# Patient Record
Sex: Female | Born: 1960 | Race: Black or African American | Hispanic: No | Marital: Married | Smoking: Former smoker
Health system: Southern US, Community
[De-identification: ages and names within clinical notes are randomized; demographics above are authoritative.]

## PROBLEM LIST (undated history)

## (undated) DIAGNOSIS — M359 Systemic involvement of connective tissue, unspecified: Secondary | ICD-10-CM

## (undated) DIAGNOSIS — M329 Systemic lupus erythematosus, unspecified: Secondary | ICD-10-CM

## (undated) DIAGNOSIS — E119 Type 2 diabetes mellitus without complications: Secondary | ICD-10-CM

## (undated) DIAGNOSIS — K5792 Diverticulitis of intestine, part unspecified, without perforation or abscess without bleeding: Secondary | ICD-10-CM

## (undated) DIAGNOSIS — Z8619 Personal history of other infectious and parasitic diseases: Secondary | ICD-10-CM

## (undated) DIAGNOSIS — I10 Essential (primary) hypertension: Secondary | ICD-10-CM

## (undated) DIAGNOSIS — IMO0002 Reserved for concepts with insufficient information to code with codable children: Secondary | ICD-10-CM

## (undated) DIAGNOSIS — M1990 Unspecified osteoarthritis, unspecified site: Secondary | ICD-10-CM

## (undated) DIAGNOSIS — G43909 Migraine, unspecified, not intractable, without status migrainosus: Secondary | ICD-10-CM

## (undated) DIAGNOSIS — Z5189 Encounter for other specified aftercare: Secondary | ICD-10-CM

## (undated) DIAGNOSIS — T7840XA Allergy, unspecified, initial encounter: Secondary | ICD-10-CM

## (undated) HISTORY — PX: ABDOMINAL HYSTERECTOMY: SHX81

## (undated) HISTORY — DX: Personal history of other infectious and parasitic diseases: Z86.19

## (undated) HISTORY — DX: Migraine, unspecified, not intractable, without status migrainosus: G43.909

## (undated) HISTORY — DX: Allergy, unspecified, initial encounter: T78.40XA

## (undated) HISTORY — PX: VASCULAR SURGERY: SHX849

## (undated) HISTORY — PX: LEG SURGERY: SHX1003

## (undated) HISTORY — DX: Unspecified osteoarthritis, unspecified site: M19.90

## (undated) HISTORY — DX: Diverticulitis of intestine, part unspecified, without perforation or abscess without bleeding: K57.92

## (undated) HISTORY — DX: Encounter for other specified aftercare: Z51.89

---

## 2016-11-03 ENCOUNTER — Emergency Department
Admission: EM | Admit: 2016-11-03 | Discharge: 2016-11-03 | Disposition: A | Discharge status: Home / Self Care | Payer: BLUE CROSS/BLUE SHIELD | Attending: Emergency Medicine | Admitting: Emergency Medicine

## 2016-11-03 ENCOUNTER — Encounter: Payer: Self-pay | Admitting: Emergency Medicine

## 2016-11-03 DIAGNOSIS — F17200 Nicotine dependence, unspecified, uncomplicated: Secondary | ICD-10-CM | POA: Diagnosis not present

## 2016-11-03 DIAGNOSIS — I10 Essential (primary) hypertension: Secondary | ICD-10-CM

## 2016-11-03 HISTORY — DX: Systemic lupus erythematosus, unspecified: M32.9

## 2016-11-03 HISTORY — DX: Essential (primary) hypertension: I10

## 2016-11-03 HISTORY — DX: Reserved for concepts with insufficient information to code with codable children: IMO0002

## 2016-11-03 LAB — BASIC METABOLIC PANEL
ANION GAP: 7 (ref 5–15)
BUN: 15 mg/dL (ref 6–20)
CHLORIDE: 106 mmol/L (ref 101–111)
CO2: 24 mmol/L (ref 22–32)
CREATININE: 0.75 mg/dL (ref 0.44–1.00)
Calcium: 9.3 mg/dL (ref 8.9–10.3)
GFR calc non Af Amer: >60 mL/min (ref >60)
Glucose, Bld: 99 mg/dL (ref 65–99)
POTASSIUM: 4 mmol/L (ref 3.5–5.1)
SODIUM: 137 mmol/L (ref 135–145)

## 2016-11-03 LAB — CBC WITH DIFFERENTIAL/PLATELET
BASOS ABS: 0.1 10*3/uL (ref 0–0.1)
BASOS PCT: 1 %
EOS ABS: 0.1 10*3/uL (ref 0–0.7)
Eosinophils Relative: 1 %
HEMATOCRIT: 44.9 % (ref 35.0–47.0)
HEMOGLOBIN: 15 g/dL (ref 12.0–16.0)
Lymphocytes Relative: 32 %
Lymphs Abs: 1.8 10*3/uL (ref 1.0–3.6)
MCH: 28.7 pg (ref 26.0–34.0)
MCHC: 33.5 g/dL (ref 32.0–36.0)
MCV: 85.8 fL (ref 80.0–100.0)
MONOS PCT: 9 %
Monocytes Absolute: 0.5 10*3/uL (ref 0.2–0.9)
NEUTROS ABS: 3.3 10*3/uL (ref 1.4–6.5)
NEUTROS PCT: 57 %
Platelets: 275 10*3/uL (ref 150–440)
RBC: 5.24 MIL/uL — AB (ref 3.80–5.20)
RDW: 13.6 % (ref 11.5–14.5)
WBC: 5.8 10*3/uL (ref 3.6–11.0)

## 2016-11-03 LAB — TROPONIN I

## 2016-11-03 MED ORDER — HYDROCHLOROTHIAZIDE 12.5 MG PO TABS: 12.5000 mg | ORAL_TABLET | Freq: Every day | ORAL | 2 refills | Status: DC

## 2016-11-03 MED ORDER — HYDROCHLOROTHIAZIDE 25 MG PO TABS
25.0000 mg | ORAL_TABLET | Freq: Once | ORAL | Status: AC
  Administered 2016-11-03: 25 mg via ORAL
  Filled 2016-11-03: qty 1

## 2016-11-03 NOTE — ED Provider Notes (Signed)
[facility] Emergency Department Provider Note  ____________________________________________  Time seen: Approximately 1:53 PM  I have reviewed the triage vital signs and the nursing notes.   HISTORY  Chief Complaint Hypertension   HPI Sheila Eaton is a 56 y.o. female with a history of hypertension and lupus who presents for evaluation of elevated blood pressure. Patient reports that she has been out of her antihypertensives for a week. She recently moved to [facility]. She saw primary care doctor 5 days ago however her prescription as never sent to the pharmacy. Today she was at work and was feeling "off". The nurse at her work place took her BP which was in the [place]. Patient then went back to her PCP's office and received a prescription for her losartan and amlodipine combination. She reports that she took one this morning. Patient is also supposed to be on hydrochlorothiazide however was not given a prescription for it. She is complaining of intermittent episodes of headachethat she describes as frontal throbbing, moderate. No headaches today. She is also complaining of intermittent visual floaters, none at this time. Also had an episode of chest tightness two days ago lasting a few minutes which resolved with no intervention. No symptoms at this time. No facial droop, slurred speech, unilateral weakness or numbness. She's not been any blood thinners.   Past Medical History:  Diagnosis Date  . Hypertension   . Lupus     There are no active problems to display for this patient.   Past Surgical History:  Procedure Laterality Date  . ABDOMINAL HYSTERECTOMY    . LEG SURGERY    . VASCULAR SURGERY      Prior to Admission medications   Medication Sig Start Date End Date Taking? Authorizing Provider  hydrochlorothiazide (HYDRODIURIL) 12.5 MG tablet Take 1 tablet (12.5 mg total) by mouth daily. 11/03/16   Rudene Re, MD     Allergies Other  History reviewed. No pertinent family history.  Social History Social History  Substance Use Topics  . Smoking status: Current Some Day Smoker  . Smokeless tobacco: Never Used  . Alcohol use Yes    Review of Systems  Constitutional: Negative for fever. Eyes: Negative for visual changes. + floaters ENT: Negative for sore throat. Neck: No neck pain  Cardiovascular: + chest pain. Respiratory: Negative for shortness of breath. Gastrointestinal: Negative for abdominal pain, vomiting or diarrhea. Genitourinary: Negative for dysuria. Musculoskeletal: Negative for back pain. Skin: Negative for rash. Neurological: Negative for  weakness or numbness. + HA Psych: No SI or HI  ____________________________________________   PHYSICAL EXAM:  VITAL SIGNS: ED Triage Vitals  Enc Vitals Group     BP 11/03/16 1056 (!) 226/125     Pulse Rate 11/03/16 1056 70     Resp 11/03/16 1056 18     Temp 11/03/16 1056 98.7 F (37.1 C)     Temp Source 11/03/16 1056 Oral     SpO2 11/03/16 1056 99 %     Weight 11/03/16 1052 215 lb (97.5 kg)     Height 11/03/16 1052 5\' 8"  (1.727 m)     Head Circumference --      Peak Flow --      Pain Score 11/03/16 1248 0     Pain Loc --      Pain Edu? --      Excl. in [place]? --     Constitutional: Alert and oriented. Well appearing and in no apparent distress. HEENT:  Head: Normocephalic and atraumatic.         Eyes: Conjunctivae are normal. Sclera is non-icteric.       Mouth/Throat: Mucous membranes are moist.       Neck: Supple with no signs of meningismus. Cardiovascular: Regular rate and rhythm. No murmurs, gallops, or rubs. 2+ symmetrical distal pulses are present in all extremities. No JVD. Respiratory: Normal respiratory effort. Lungs are clear to auscultation bilaterally. No wheezes, crackles, or rhonchi.  Gastrointestinal: Soft, non tender, and non distended with positive bowel sounds. No rebound or  guarding. Musculoskeletal: Nontender with normal range of motion in all extremities. No edema, cyanosis, or erythema of extremities. Neurologic: Normal speech and language. A & O x3, PERRL, no nystagmus, CN II-XII intact, motor testing reveals good tone and bulk throughout. There is no evidence of pronator drift or dysmetria. Muscle strength is 5/5 throughout.  Sensory examination is intact. Gait is normal. Skin: Skin is warm, dry and intact. No rash noted. Psychiatric: Mood and affect are normal. Speech and behavior are normal.  ____________________________________________   LABS (all labs ordered are listed, but only abnormal results are displayed)  Labs Reviewed  CBC WITH DIFFERENTIAL/PLATELET - Abnormal; Notable for the following:       Result Value   RBC 5.24 (*)    All other components within normal limits  BASIC METABOLIC PANEL  TROPONIN I   ____________________________________________  EKG  ED ECG REPORT I, Rudene Re, the attending physician, personally viewed and interpreted this ECG.  Normal sinus rhythm, rate of 65, normal intervals, normal axis, Q waves in anterior leads, no ST elevations or depressions. No prior for comparison ____________________________________________  RADIOLOGY  none  ____________________________________________   PROCEDURES  Procedure(s) performed: None Procedures Critical Care performed:  None ____________________________________________   INITIAL IMPRESSION / ASSESSMENT AND PLAN / ED COURSE  56 y.o. female with a history of hypertension and lupus who presents for evaluation of elevated blood pressure in the setting of medication noncompliance for a week. Patient is completely neurologically intact and asymptomatic at this time. Had an episode of chest pain a week ago with a normal EKG and one troponin that is negative. No indication for head CT. Blood work with no acute findings including normal electrolytes and kidney  function. Patient was restarted on her losartan and amlodipine prior to arrival to the emergency room and was restarted on hydrochlorothiazide here. Her blood pressure is trending down area and she remains asymptomatic and well-appearing. Patient can be discharged home at this time     Pertinent labs & imaging results that were available during my care of the patient were reviewed by me and considered in my medical decision making (see chart for details).    ____________________________________________   FINAL CLINICAL IMPRESSION(S) / ED DIAGNOSES  Final diagnoses:  Hypertension, unspecified type      NEW MEDICATIONS STARTED DURING THIS VISIT:  New Prescriptions   HYDROCHLOROTHIAZIDE (HYDRODIURIL) 12.5 MG TABLET    Take 1 tablet (12.5 mg total) by mouth daily.     Note:  This document was prepared using Dragon voice recognition software and may include unintentional dictation errors.    Alfred Levins, [place], MD 11/03/16 [phone]

## 2016-11-03 NOTE — ED Notes (Signed)
Pt states used to take hydrochlorothiazide but ran out x week and doctor started her on amlodipine this AM.

## 2016-11-03 NOTE — ED Triage Notes (Addendum)
Pt c/o headache since yesterday. Took blood pressure and was up at home.  Elevated in triage. Denies CP/SHOB.  C/o headache and seeing floaters in both eyes.  Skin warm and dry. Respirations unlabored.  Pt is out both her blood pressure meds for a couple weeks.  Got set up at a doctor and saw them last week and they were supposed to have sent meds in and they never got sent in. Finally got meds and took it this morning but came because so elevated.

## 2016-11-03 NOTE — ED Notes (Signed)
Discussed pt with dr Alfred Levins

## 2016-11-03 NOTE — ED Notes (Signed)
Pt eating [employer] at bedside with visitor.

## 2017-05-17 ENCOUNTER — Other Ambulatory Visit: Payer: Self-pay

## 2017-05-17 ENCOUNTER — Encounter: Payer: Self-pay | Admitting: Emergency Medicine

## 2017-05-17 ENCOUNTER — Emergency Department
Admission: EM | Admit: 2017-05-17 | Discharge: 2017-05-17 | Disposition: A | Discharge status: Home / Self Care | Payer: BLUE CROSS/BLUE SHIELD | Attending: Student in an Organized Health Care Education/Training Program | Admitting: Student in an Organized Health Care Education/Training Program

## 2017-05-17 ENCOUNTER — Emergency Department: Payer: BLUE CROSS/BLUE SHIELD

## 2017-05-17 DIAGNOSIS — I10 Essential (primary) hypertension: Secondary | ICD-10-CM | POA: Diagnosis not present

## 2017-05-17 DIAGNOSIS — R079 Chest pain, unspecified: Secondary | ICD-10-CM | POA: Diagnosis present

## 2017-05-17 DIAGNOSIS — M3210 Systemic lupus erythematosus, organ or system involvement unspecified: Secondary | ICD-10-CM | POA: Insufficient documentation

## 2017-05-17 DIAGNOSIS — F17210 Nicotine dependence, cigarettes, uncomplicated: Secondary | ICD-10-CM | POA: Insufficient documentation

## 2017-05-17 DIAGNOSIS — Z79899 Other long term (current) drug therapy: Secondary | ICD-10-CM | POA: Insufficient documentation

## 2017-05-17 DIAGNOSIS — R0602 Shortness of breath: Secondary | ICD-10-CM | POA: Insufficient documentation

## 2017-05-17 HISTORY — DX: Systemic involvement of connective tissue, unspecified: M35.9

## 2017-05-17 LAB — CBC
HEMATOCRIT: 41.9 % (ref 35.0–47.0)
Hemoglobin: 14.2 g/dL (ref 12.0–16.0)
MCH: 29.4 pg (ref 26.0–34.0)
MCHC: 33.8 g/dL (ref 32.0–36.0)
MCV: 87.2 fL (ref 80.0–100.0)
Platelets: 317 10*3/uL (ref 150–440)
RBC: 4.81 MIL/uL (ref 3.80–5.20)
RDW: 13.5 % (ref 11.5–14.5)
WBC: 9.1 10*3/uL (ref 3.6–11.0)

## 2017-05-17 LAB — BASIC METABOLIC PANEL
ANION GAP: 11 (ref 5–15)
BUN: 18 mg/dL (ref 6–20)
CO2: 26 mmol/L (ref 22–32)
Calcium: 9.3 mg/dL (ref 8.9–10.3)
Chloride: 102 mmol/L (ref 101–111)
Creatinine, Ser: 0.69 mg/dL (ref 0.44–1.00)
Glucose, Bld: 89 mg/dL (ref 65–99)
POTASSIUM: 3.4 mmol/L — AB (ref 3.5–5.1)
Sodium: 139 mmol/L (ref 135–145)

## 2017-05-17 LAB — TROPONIN I: Troponin I: <0.03 ng/mL (ref <0.03)

## 2017-05-17 MED ORDER — HYDROCODONE-ACETAMINOPHEN 5-325 MG PO TABS: 1.0000 | ORAL_TABLET | ORAL | 0 refills | Status: DC | PRN

## 2017-05-17 MED ORDER — IOPAMIDOL (ISOVUE-370) INJECTION 76%
75.0000 mL | Freq: Once | INTRAVENOUS | Status: AC | PRN
  Administered 2017-05-17: 75 mL via INTRAVENOUS
  Filled 2017-05-17: qty 75

## 2017-05-17 MED ORDER — NAPROXEN 500 MG PO TABS: 500.0000 mg | ORAL_TABLET | Freq: Two times a day (BID) | ORAL | 0 refills | Status: AC

## 2017-05-17 MED ORDER — HYDROCODONE-ACETAMINOPHEN 5-325 MG PO TABS
1.0000 | ORAL_TABLET | Freq: Once | ORAL | Status: AC
  Administered 2017-05-17: 1 via ORAL
  Filled 2017-05-17: qty 1

## 2017-05-17 NOTE — ED Triage Notes (Signed)
ARrives from [place] for ED evaluation of Chest pain and SOB since Friday.  Denies c/o cough and fever.  STates pain is improved with muscle relaxers.

## 2017-05-17 NOTE — ED Provider Notes (Signed)
[facility] Emergency Department Provider Note    First MD Initiated Contact with Patient 05/17/17 1457     (approximate)  I have reviewed the triage vital signs and the nursing notes.   HISTORY  Chief Complaint Chest Pain    HPI Sheila Eaton is a 57 y.o. female history of hypertension as well as lupus presents for evaluation of chest pain shortness of breath since Friday.  No fever or cough.  States that it is worse with movement and has gotten some improvement after taking muscle relaxers but still has persistent discomfort.  Denies any diaphoresis nausea or vomiting.  No recent antibiotics.  No lower extremity swelling.  Does feel more pressure and discomfort when laying flat.  Pain is reproduced with palpation of the anterior chest wall.  Denies any trauma or heavy lifting.  Past Medical History:  Diagnosis Date  . Collagen vascular disease (HCC)    lupus  . Hypertension   . Lupus    No family history on file. Past Surgical History:  Procedure Laterality Date  . ABDOMINAL HYSTERECTOMY    . LEG SURGERY    . VASCULAR SURGERY     There are no active problems to display for this patient.     Prior to Admission medications   Medication Sig Start Date End Date Taking? Authorizing Provider  hydrochlorothiazide (HYDRODIURIL) 12.5 MG tablet Take 1 tablet (12.5 mg total) by mouth daily. 11/03/16   Rudene Re, MD  HYDROcodone-acetaminophen [facility]) 5-325 MG tablet Take 1 tablet by mouth every 4 (four) hours as needed for moderate pain. 05/17/17   Merlyn Lot, MD  naproxen (NAPROSYN) 500 MG tablet Take 1 tablet (500 mg total) by mouth 2 (two) times daily with a meal. 05/17/17 05/17/18  Merlyn Lot, MD    Allergies Other    Social History Social History   Tobacco Use  . Smoking status: Current Some Day Smoker  . Smokeless tobacco: Never Used  Substance Use Topics  . Alcohol use: Yes  . Drug use: No    Review of  Systems Patient denies headaches, rhinorrhea, blurry vision, numbness, shortness of breath, chest pain, edema, cough, abdominal pain, nausea, vomiting, diarrhea, dysuria, fevers, rashes or hallucinations unless otherwise stated above in HPI. ____________________________________________   PHYSICAL EXAM:  VITAL SIGNS: Vitals:   05/17/17 1503  BP: 138/84  SpO2: 98%    Constitutional: Alert and oriented. Well appearing and in no acute distress. Eyes: Conjunctivae are normal.  Head: Atraumatic. Nose: No congestion/rhinnorhea. Mouth/Throat: Mucous membranes are moist.   Neck: No stridor. Painless ROM.  Cardiovascular: Normal rate, regular rhythm. Grossly normal heart sounds.  Good peripheral circulation. Respiratory: Normal respiratory effort.  No retractions. Lungs CTAB. Gastrointestinal: Soft and nontender. No distention. No abdominal bruits. No CVA tenderness. Genitourinary:  Musculoskeletal: pain reproduced with palpation of anterior chest wall. No lower extremity tenderness nor edema.  No joint effusions. Neurologic:  Normal speech and language. No gross focal neurologic deficits are appreciated. No facial droop Skin:  Skin is warm, dry and intact. No rash noted. Psychiatric: Mood and affect are normal. Speech and behavior are normal.  ____________________________________________   LABS (all labs ordered are listed, but only abnormal results are displayed)  Results for orders placed or performed during the hospital encounter of 05/17/17 (from the past 24 hour(s))  Basic metabolic panel     Status: Abnormal   Collection Time: 05/17/17  1:32 PM  Result Value Ref Range   Sodium 139  135 - 145 mmol/L   Potassium 3.4 (L) 3.5 - 5.1 mmol/L   Chloride 102 101 - 111 mmol/L   CO2 26 22 - 32 mmol/L   Glucose, Bld 89 65 - 99 mg/dL   BUN 18 6 - 20 mg/dL   Creatinine, Ser 0.69 0.44 - 1.00 mg/dL   Calcium 9.3 8.9 - 10.3 mg/dL   GFR calc non Af Amer >60 >60 mL/min   GFR calc Af Amer >60  >60 mL/min   Anion gap 11 5 - 15  CBC     Status: None   Collection Time: 05/17/17  1:32 PM  Result Value Ref Range   WBC 9.1 3.6 - 11.0 K/uL   RBC 4.81 3.80 - 5.20 MIL/uL   Hemoglobin 14.2 12.0 - 16.0 g/dL   HCT 41.9 35.0 - 47.0 %   MCV 87.2 80.0 - 100.0 fL   MCH 29.4 26.0 - 34.0 pg   MCHC 33.8 32.0 - 36.0 g/dL   RDW 13.5 11.5 - 14.5 %   Platelets 317 150 - 440 K/uL  Troponin I     Status: None   Collection Time: 05/17/17  1:32 PM  Result Value Ref Range   Troponin I <0.03 <0.03 ng/mL  Troponin I     Status: None   Collection Time: 05/17/17  4:43 PM  Result Value Ref Range   Troponin I <0.03 <0.03 ng/mL   ____________________________________________  EKG My review and personal interpretation at Time: 13:30   Indication: chest pain  Rate: 80  Rhythm: sinus Axis: normal Other: normal intervals, no stemi, t wave inversions in I and aVL  ____________________________________________  RADIOLOGY  I personally reviewed all radiographic images ordered to evaluate for the above acute complaints and reviewed radiology reports and findings.  These findings were personally discussed with the patient.  Please see medical record for radiology report.  ____________________________________________   PROCEDURES  Procedure(s) performed:  Procedures    Critical Care performed: no ____________________________________________   INITIAL IMPRESSION / ASSESSMENT AND PLAN / ED COURSE  Pertinent labs & imaging results that were available during my care of the patient were reviewed by me and considered in my medical decision making (see chart for details).  DDX: ACS, pericarditis, esophagitis, boerhaaves, pe, dissection, pna, bronchitis, costochondritis   Sheila Eaton is a 57 y.o. who presents to the ED with chest discomfort as described above for the past several days.  She arrives afebrile and hemodynamically stable.  EKG does show some nonspecific changes including T  wave inversions in 1 and a mL but no reciprocal changes no recent EKG to compare this to.  Patient is otherwise low risk heart score of 3 versus 4 depending on subjectivity therefore will repeat troponin to further risk stratify.  Her abdominal exam is soft and benign.  CT angiogram ordered to evaluate for pulmonary embolism shows retrosternal lymphadenopathy but no evidence of mass.  Patient just recently had a reassuring mammogram.  Possibly reactive particular in the setting of her lupus.  Given her musculoskeletal pain do suspect that that is the primary driver of her discomfort.  This does not seem to reflect ACS.  Will give outpatient referral to cardiology.      ____________________________________________   FINAL CLINICAL IMPRESSION(S) / ED DIAGNOSES  Final diagnoses:  Chest pain, unspecified type      NEW MEDICATIONS STARTED DURING THIS VISIT:  New Prescriptions   HYDROCODONE-ACETAMINOPHEN (NORCO) 5-325 MG TABLET    Take 1 tablet by  mouth every 4 (four) hours as needed for moderate pain.   NAPROXEN (NAPROSYN) 500 MG TABLET    Take 1 tablet (500 mg total) by mouth 2 (two) times daily with a meal.     Note:  This document was prepared using Dragon voice recognition software and may include unintentional dictation errors.    Merlyn Lot, MD 05/17/17 1726

## 2017-05-17 NOTE — ED Notes (Signed)
esign not working pt verbalized discharge instructions and has no questions at this time 

## 2017-09-16 ENCOUNTER — Other Ambulatory Visit: Payer: Self-pay | Admitting: Internal Medicine

## 2017-09-16 DIAGNOSIS — R0602 Shortness of breath: Secondary | ICD-10-CM

## 2017-09-21 ENCOUNTER — Ambulatory Visit: Payer: BLUE CROSS/BLUE SHIELD | Attending: Internal Medicine

## 2017-09-21 DIAGNOSIS — J449 Chronic obstructive pulmonary disease, unspecified: Secondary | ICD-10-CM | POA: Diagnosis not present

## 2017-09-21 DIAGNOSIS — R0602 Shortness of breath: Secondary | ICD-10-CM | POA: Diagnosis not present

## 2017-09-21 DIAGNOSIS — Z87891 Personal history of nicotine dependence: Secondary | ICD-10-CM | POA: Diagnosis not present

## 2018-01-14 ENCOUNTER — Other Ambulatory Visit: Payer: Self-pay | Admitting: Internal Medicine

## 2018-01-14 DIAGNOSIS — Z1231 Encounter for screening mammogram for malignant neoplasm of breast: Secondary | ICD-10-CM

## 2018-01-27 ENCOUNTER — Ambulatory Visit
Admission: RE | Admit: 2018-01-27 | Discharge: 2018-01-27 | Disposition: A | Discharge status: Home / Self Care | Payer: BLUE CROSS/BLUE SHIELD | Source: Ambulatory Visit | Attending: Internal Medicine | Admitting: Internal Medicine

## 2018-01-27 DIAGNOSIS — Z1231 Encounter for screening mammogram for malignant neoplasm of breast: Secondary | ICD-10-CM | POA: Insufficient documentation

## 2018-09-20 ENCOUNTER — Other Ambulatory Visit: Payer: Self-pay

## 2018-09-20 ENCOUNTER — Telehealth: Payer: Self-pay

## 2018-09-20 DIAGNOSIS — Z1211 Encounter for screening for malignant neoplasm of colon: Secondary | ICD-10-CM

## 2018-09-20 NOTE — Telephone Encounter (Signed)
Gastroenterology Pre-Procedure Review  Request Date: 10/21/18 Requesting Physician: Dr. Vicente Males  PATIENT REVIEW QUESTIONS: The patient responded to the following health history questions as indicated:    1. Are you having any GI issues? yes (heartburn occasionally) 2. Do you have a personal history of Polyps? unsure thinks yes maybe 6-7 years ago 3. Do you have a family history of Colon Cancer or Polyps? no 4. Diabetes Mellitus? no 5. Joint replacements in the past 12 months?no 6. Major health problems in the past 3 months?no 7. Any artificial heart valves, MVP, or defibrillator?no    MEDICATIONS & ALLERGIES:    Patient reports the following regarding taking any anticoagulation/antiplatelet therapy:   Plavix, Coumadin, Eliquis, Xarelto, Lovenox, Pradaxa, Brilinta, or Effient? no Aspirin? no  Patient confirms/reports the following medications:  Current Outpatient Medications  Medication Sig Dispense Refill  . hydrochlorothiazide (HYDRODIURIL) 12.5 MG tablet Take 1 tablet (12.5 mg total) by mouth daily. 30 tablet 2  . HYDROcodone-acetaminophen (NORCO) 5-325 MG tablet Take 1 tablet by mouth every 4 (four) hours as needed for moderate pain. 6 tablet 0   No current facility-administered medications for this visit.     Patient confirms/reports the following allergies:  Allergies  Allergen Reactions  . Other     Rash with citrus and chocolate    No orders of the defined types were placed in this encounter.   AUTHORIZATION INFORMATION Primary Insurance: 1D#: Group #:  Secondary Insurance: 1D#: Group #:  SCHEDULE INFORMATION: Date: 10/21/18 Time: Location:[facility]

## 2018-09-21 ENCOUNTER — Other Ambulatory Visit: Payer: Self-pay | Admitting: *Deleted

## 2018-10-17 ENCOUNTER — Telehealth: Payer: Self-pay

## 2018-10-17 ENCOUNTER — Other Ambulatory Visit
Admission: RE | Admit: 2018-10-17 | Discharge: 2018-10-17 | Disposition: A | Discharge status: Home / Self Care | Payer: BC Managed Care – PPO | Source: Ambulatory Visit | Attending: Gastroenterology | Admitting: Gastroenterology

## 2018-10-17 ENCOUNTER — Other Ambulatory Visit: Payer: Self-pay

## 2018-10-17 DIAGNOSIS — Z20828 Contact with and (suspected) exposure to other viral communicable diseases: Secondary | ICD-10-CM | POA: Insufficient documentation

## 2018-10-17 DIAGNOSIS — Z20822 Contact with and (suspected) exposure to covid-19: Secondary | ICD-10-CM

## 2018-10-17 LAB — SARS CORONAVIRUS 2 (TAT 6-24 HRS): SARS Coronavirus 2: NEGATIVE

## 2018-10-17 NOTE — Telephone Encounter (Signed)
Patient has been advised that she may have her COVID test today as her Colonoscopy is on Friday.  Thanks [employer]

## 2018-10-18 ENCOUNTER — Other Ambulatory Visit: Payer: BC Managed Care – PPO

## 2018-10-20 LAB — NOVEL CORONAVIRUS, NAA: SARS-CoV-2, NAA: NOT DETECTED

## 2018-10-21 ENCOUNTER — Ambulatory Visit: Payer: BC Managed Care – PPO | Admitting: Certified Registered Nurse Anesthetist

## 2018-10-21 ENCOUNTER — Other Ambulatory Visit: Payer: Self-pay

## 2018-10-21 ENCOUNTER — Encounter: Payer: Self-pay | Admitting: Certified Registered Nurse Anesthetist

## 2018-10-21 ENCOUNTER — Ambulatory Visit
Admission: RE | Admit: 2018-10-21 | Discharge: 2018-10-21 | Disposition: A | Discharge status: Home / Self Care | Payer: BC Managed Care – PPO | Attending: Gastroenterology | Admitting: Gastroenterology

## 2018-10-21 ENCOUNTER — Encounter
Admission: RE | Disposition: A | Discharge status: Home / Self Care | Payer: Self-pay | Source: Home / Self Care | Attending: Gastroenterology

## 2018-10-21 DIAGNOSIS — Z79899 Other long term (current) drug therapy: Secondary | ICD-10-CM | POA: Diagnosis not present

## 2018-10-21 DIAGNOSIS — K635 Polyp of colon: Secondary | ICD-10-CM

## 2018-10-21 DIAGNOSIS — F17200 Nicotine dependence, unspecified, uncomplicated: Secondary | ICD-10-CM | POA: Insufficient documentation

## 2018-10-21 DIAGNOSIS — I10 Essential (primary) hypertension: Secondary | ICD-10-CM | POA: Insufficient documentation

## 2018-10-21 DIAGNOSIS — M329 Systemic lupus erythematosus, unspecified: Secondary | ICD-10-CM | POA: Insufficient documentation

## 2018-10-21 DIAGNOSIS — K5730 Diverticulosis of large intestine without perforation or abscess without bleeding: Secondary | ICD-10-CM | POA: Diagnosis not present

## 2018-10-21 DIAGNOSIS — Z1211 Encounter for screening for malignant neoplasm of colon: Secondary | ICD-10-CM | POA: Insufficient documentation

## 2018-10-21 HISTORY — PX: COLONOSCOPY WITH PROPOFOL: SHX5780

## 2018-10-21 SURGERY — COLONOSCOPY WITH PROPOFOL
Anesthesia: General

## 2018-10-21 MED ORDER — PROPOFOL 500 MG/50ML IV EMUL
INTRAVENOUS | Status: DC | PRN
  Administered 2018-10-21: 140 ug/kg/min via INTRAVENOUS

## 2018-10-21 MED ORDER — PROPOFOL 500 MG/50ML IV EMUL
INTRAVENOUS | Status: AC
  Filled 2018-10-21: qty 50

## 2018-10-21 MED ORDER — LIDOCAINE HCL (CARDIAC) PF 100 MG/5ML IV SOSY
PREFILLED_SYRINGE | INTRAVENOUS | Status: DC | PRN
  Administered 2018-10-21: 50 mg via INTRAVENOUS

## 2018-10-21 MED ORDER — PROPOFOL 10 MG/ML IV BOLUS
INTRAVENOUS | Status: DC | PRN
  Administered 2018-10-21: 60 mg via INTRAVENOUS
  Administered 2018-10-21: 80 mg via INTRAVENOUS

## 2018-10-21 MED ORDER — SODIUM CHLORIDE 0.9 % IV SOLN
INTRAVENOUS | Status: DC
  Administered 2018-10-21: 09:00:00 via INTRAVENOUS

## 2018-10-21 MED ORDER — LIDOCAINE HCL (PF) 2 % IJ SOLN
INTRAMUSCULAR | Status: AC
  Filled 2018-10-21: qty 10

## 2018-10-21 NOTE — Anesthesia Post-op Follow-up Note (Signed)
Anesthesia QCDR form completed.        

## 2018-10-21 NOTE — H&P (Signed)
Jonathon Bellows, MD [address], [place], [place], [place], [address] [address], [place], [place], [place], [address] Phone: [phone]  Fax: [phone]  Primary Care Physician:  Casilda Carls, MD   Pre-Procedure History & Physical: HPI:  Sheila Eaton is a 58 y.o. female is here for an colonoscopy.   Past Medical History:  Diagnosis Date  . Collagen vascular disease (HCC)    lupus  . Hypertension   . Lupus [facility])     Past Surgical History:  Procedure Laterality Date  . ABDOMINAL HYSTERECTOMY    . LEG SURGERY    . VASCULAR SURGERY      Prior to Admission medications   Medication Sig Start Date End Date Taking? Authorizing Provider  hydrochlorothiazide (HYDRODIURIL) 12.5 MG tablet Take 1 tablet (12.5 mg total) by mouth daily. 11/03/16   Rudene Re, MD  HYDROcodone-acetaminophen [facility]) 5-325 MG tablet Take 1 tablet by mouth every 4 (four) hours as needed for moderate pain. 05/17/17   Merlyn Lot, MD    Allergies as of 09/21/2018 - Review Complete 05/17/2017  Allergen Reaction Noted  . Other  11/03/2016    Family History  Problem Relation Age of Onset  . Breast cancer Neg Hx     Social History   Socioeconomic History  . Marital status: Married    Spouse name: Not on file  . Number of children: Not on file  . Years of education: Not on file  . Highest education level: Not on file  Occupational History  . Not on file  Social Needs  . Financial resource strain: Not on file  . Food insecurity    Worry: Not on file    Inability: Not on file  . Transportation needs    Medical: Not on file    Non-medical: Not on file  Tobacco Use  . Smoking status: Current Some Day Smoker  . Smokeless tobacco: Never Used  Substance and Sexual Activity  . Alcohol use: Yes  . Drug use: No  . Sexual activity: Not on file  Lifestyle  . Physical activity    Days per week: Not on file    Minutes per session: Not on file  . Stress: Not on  file  Relationships  . Social Herbalist on phone: Not on file    Gets together: Not on file    Attends religious service: Not on file    Active member of club or organization: Not on file    Attends meetings of clubs or organizations: Not on file    Relationship status: Not on file  . Intimate partner violence    Fear of current or ex partner: Not on file    Emotionally abused: Not on file    Physically abused: Not on file    Forced sexual activity: Not on file  Other Topics Concern  . Not on file  Social History Narrative  . Not on file    Review of Systems: See HPI, otherwise negative ROS  Physical Exam: There were no vitals taken for this visit. General:   Alert,  pleasant and cooperative in NAD Head:  Normocephalic and atraumatic. Neck:  Supple; no masses or thyromegaly. Lungs:  Clear throughout to auscultation, normal respiratory effort.    Heart:  +S1, +S2, Regular rate and rhythm, No edema. Abdomen:  Soft, nontender and nondistended. Normal bowel sounds, without guarding, and without rebound.   Neurologic:  Alert and  oriented x4;  grossly normal neurologically.  Impression/Plan: Sheila Eaton is here for an colonoscopy to be performed for Screening colonoscopy average risk   Risks, benefits, limitations, and alternatives regarding  colonoscopy have been reviewed with the patient.  Questions have been answered.  All parties agreeable.   Jonathon Bellows, MD  10/21/2018, 8:54 AM

## 2018-10-21 NOTE — Transfer of Care (Signed)
Immediate Anesthesia Transfer of Care Note  Patient: Sheila Eaton  Procedure(s) Performed: COLONOSCOPY WITH PROPOFOL (N/A )  Patient Location: PACU and Endoscopy Unit  Anesthesia Type:General  Level of Consciousness: drowsy  Airway & Oxygen Therapy: Patient Spontanous Breathing  Post-op Assessment: Report given to RN and Post -op Vital signs reviewed and stable  Post vital signs: Reviewed and stable  Last Vitals:  Vitals Value Taken Time  BP 85/62 10/21/18 0958  Temp 36.3 C 10/21/18 0958  Pulse 71 10/21/18 0958  Resp 18 10/21/18 0958  SpO2 93 % 10/21/18 0958  Vitals shown include unvalidated device data.  Last Pain:  Vitals:   10/21/18 0958  TempSrc: Tympanic  PainSc: 0-No pain         Complications: No apparent anesthesia complications

## 2018-10-21 NOTE — Op Note (Signed)
[facility] Gastroenterology Patient Name: Sheila Eaton Procedure Date: 10/21/2018 9:15 AM MRN: [id number] Account #: [id number] Date of Birth: 1960/04/29 Admit Type: Outpatient Age: 58 Room: [facility] ENDO ROOM 4 Gender: Female Note Status: Finalized Procedure:            Colonoscopy Indications:          Screening for colorectal malignant neoplasm Providers:            Jonathon Bellows MD, MD Referring MD:         Casilda Carls, MD (Referring MD) Medicines:            Monitored Anesthesia Care Complications:        No immediate complications. Procedure:            Pre-Anesthesia Assessment:                       - Prior to the procedure, a History and Physical was                        performed, and patient medications, allergies and                        sensitivities were reviewed. The patient's tolerance of                        previous anesthesia was reviewed.                       - The risks and benefits of the procedure and the                        sedation options and risks were discussed with the                        patient. All questions were answered and informed                        consent was obtained.                       - ASA Grade Assessment: II - A patient with mild                        systemic disease.                       After obtaining informed consent, the colonoscope was                        passed under direct vision. Throughout the procedure,                        the patient's blood pressure, pulse, and oxygen                        saturations were monitored continuously. The                        Colonoscope was introduced through the anus and  advanced to the the cecum, identified by the                        appendiceal orifice, IC valve and transillumination.                        The colonoscopy was performed with ease. The patient                        tolerated the procedure  well. The quality of the bowel                        preparation was excellent. Findings:      The perianal and digital rectal examinations were normal.      Multiple small-mouthed diverticula were found in the entire colon.      Three sessile polyps were found in the recto-sigmoid colon. The polyps       were 3 to 4 mm in size. These polyps were removed with a cold snare.       Resection and retrieval were complete.      The exam was otherwise without abnormality on direct and retroflexion       views. Impression:           - Diverticulosis in the entire examined colon.                       - Three 3 to 4 mm polyps at the recto-sigmoid colon,                        removed with a cold snare. Resected and retrieved.                       - The examination was otherwise normal on direct and                        retroflexion views. Recommendation:       - Discharge patient to home (with escort).                       - Resume previous diet.                       - Continue present medications.                       - Await pathology results.                       - Repeat colonoscopy for surveillance based on                        pathology results. Procedure Code(s):    --- Professional ---                       [phone], Colonoscopy, flexible; with removal of tumor(s),                        polyp(s), or other lesion(s) by snare technique Diagnosis Code(s):    --- Professional ---  Z12.11, Encounter for screening for malignant neoplasm                        of colon                       K63.5, Polyp of colon                       K57.30, Diverticulosis of large intestine without                        perforation or abscess without bleeding CPT copyright 2019 American Medical Association. All rights reserved. The codes documented in this report are preliminary and upon coder review may  be revised to meet current compliance requirements. Jonathon Bellows, MD Jonathon Bellows MD, MD 10/21/2018 9:55:26 AM This report has been signed electronically. Number of Addenda: 0 Note Initiated On: 10/21/2018 9:15 AM Scope Withdrawal Time: 0 hours 15 minutes 6 seconds  Total Procedure Duration: 0 hours 17 minutes 47 seconds  Estimated Blood Loss: Estimated blood loss: none.      [facility]

## 2018-10-21 NOTE — Anesthesia Preprocedure Evaluation (Signed)
Anesthesia Evaluation  Patient identified by MRN, date of birth, ID band Patient awake    Reviewed: Allergy & Precautions, NPO status , Patient's Chart, lab work & pertinent test results  History of Anesthesia Complications Negative for: history of anesthetic complications  Airway Mallampati: II  TM Distance: >3 FB Neck ROM: Full    Dental no notable dental hx.    Pulmonary neg sleep apnea, neg COPD, Current Smoker,    breath sounds clear to auscultation- rhonchi (-) wheezing      Cardiovascular hypertension, Pt. on medications (-) CAD, (-) Past MI, (-) Cardiac Stents and (-) CABG  Rhythm:Regular Rate:Normal - Systolic murmurs and - Diastolic murmurs    Neuro/Psych neg Seizures negative neurological ROS  negative psych ROS   GI/Hepatic negative GI ROS, Neg liver ROS,   Endo/Other  negative endocrine ROSneg diabetes  Renal/GU negative Renal ROS     Musculoskeletal negative musculoskeletal ROS (+)   Abdominal (+) + obese,   Peds  Hematology negative hematology ROS (+)   Anesthesia Other Findings Past Medical History: No date: Collagen vascular disease ([place])     Comment:  lupus No date: Hypertension No date: Lupus ([place])   Reproductive/Obstetrics                             Anesthesia Physical Anesthesia Plan  ASA: II  Anesthesia Plan: General   Post-op Pain Management:    Induction: Intravenous  PONV Risk Score and Plan: 1 and Propofol infusion  Airway Management Planned: Natural Airway  Additional Equipment:   Intra-op Plan:   Post-operative Plan:   Informed Consent: I have reviewed the patients History and Physical, chart, labs and discussed the procedure including the risks, benefits and alternatives for the proposed anesthesia with the patient or authorized representative who has indicated his/her understanding and acceptance.     Dental advisory given  Plan  Discussed with: CRNA and Anesthesiologist  Anesthesia Plan Comments:         Anesthesia Quick Evaluation

## 2018-10-21 NOTE — Anesthesia Postprocedure Evaluation (Signed)
Anesthesia Post Note  Patient: Sheila Eaton  Procedure(s) Performed: COLONOSCOPY WITH PROPOFOL (N/A )  Patient location during evaluation: Endoscopy Anesthesia Type: General Level of consciousness: awake and alert and oriented Pain management: pain level controlled Vital Signs Assessment: post-procedure vital signs reviewed and stable Respiratory status: spontaneous breathing, nonlabored ventilation and respiratory function stable Cardiovascular status: blood pressure returned to baseline and stable Postop Assessment: no signs of nausea or vomiting Anesthetic complications: no     Last Vitals:  Vitals:   10/21/18 1008 10/21/18 1018  BP: 111/62 115/73  Pulse: 71 68  Resp: (!) 21 17  Temp:  (!) 36.4 C  SpO2: 90% 93%    Last Pain:  Vitals:   10/21/18 1018  TempSrc: Tympanic  PainSc: 0-No pain                 Kyron Schlitt

## 2018-10-24 ENCOUNTER — Encounter: Payer: Self-pay | Admitting: Gastroenterology

## 2018-10-24 LAB — SURGICAL PATHOLOGY

## 2018-10-27 ENCOUNTER — Encounter: Payer: Self-pay | Admitting: Gastroenterology

## 2018-11-11 DIAGNOSIS — M7062 Trochanteric bursitis, left hip: Secondary | ICD-10-CM | POA: Insufficient documentation

## 2018-11-11 DIAGNOSIS — M47816 Spondylosis without myelopathy or radiculopathy, lumbar region: Secondary | ICD-10-CM | POA: Insufficient documentation

## 2018-11-11 DIAGNOSIS — M7061 Trochanteric bursitis, right hip: Secondary | ICD-10-CM | POA: Insufficient documentation

## 2019-05-03 DIAGNOSIS — M791 Myalgia, unspecified site: Secondary | ICD-10-CM | POA: Insufficient documentation

## 2019-05-03 DIAGNOSIS — Z13820 Encounter for screening for osteoporosis: Secondary | ICD-10-CM | POA: Insufficient documentation

## 2019-06-03 ENCOUNTER — Ambulatory Visit: Payer: BC Managed Care – PPO | Attending: Internal Medicine

## 2019-06-03 DIAGNOSIS — Z23 Encounter for immunization: Secondary | ICD-10-CM

## 2019-06-03 NOTE — Progress Notes (Signed)
   Covid-19 Vaccination Clinic  Name:  Sheila Eaton    MRN: [id number] DOB: [birth year]  06/03/2019  Sheila Eaton was observed post Covid-19 immunization for 15 minutes without incident. She was provided with Vaccine Information Sheet and instruction to access the V-Safe system.   Sheila Eaton was instructed to call 911 with any severe reactions post vaccine: Marland Kitchen Difficulty breathing  . Swelling of face and throat  . A fast heartbeat  . A bad rash all over body  . Dizziness and weakness   Immunizations Administered    Name Date Dose VIS Date Route   Pfizer COVID-19 Vaccine 06/03/2019  8:39 AM 0.3 mL 03/03/2019 Intramuscular   Manufacturer: [place]   Lot: [id number]   [place]: [id number]

## 2019-06-28 ENCOUNTER — Ambulatory Visit: Payer: BC Managed Care – PPO | Attending: Internal Medicine

## 2019-06-28 DIAGNOSIS — Z23 Encounter for immunization: Secondary | ICD-10-CM

## 2019-06-28 NOTE — Progress Notes (Signed)
   Covid-19 Vaccination Clinic  Name:  Sheila Eaton    MRN: [id number] DOB: Jul 17, 1960  06/28/2019  Sheila Eaton was observed post Covid-19 immunization for 15 minutes without incident. She was provided with Vaccine Information Sheet and instruction to access the V-Safe system.   Sheila Eaton was instructed to call 911 with any severe reactions post vaccine: Marland Kitchen Difficulty breathing  . Swelling of face and throat  . A fast heartbeat  . A bad rash all over body  . Dizziness and weakness   Immunizations Administered    Name Date Dose VIS Date Route   Pfizer COVID-19 Vaccine 06/28/2019  4:48 PM 0.3 mL 03/03/2019 Intramuscular   Manufacturer: [place]   Lot: [id number]   [place]: [id number]

## 2019-08-03 ENCOUNTER — Other Ambulatory Visit: Payer: Self-pay

## 2019-08-03 ENCOUNTER — Encounter: Payer: Self-pay | Admitting: Emergency Medicine

## 2019-08-03 ENCOUNTER — Emergency Department: Payer: BC Managed Care – PPO

## 2019-08-03 ENCOUNTER — Emergency Department
Admission: EM | Admit: 2019-08-03 | Discharge: 2019-08-03 | Disposition: A | Discharge status: Home / Self Care | Payer: BC Managed Care – PPO | Attending: Emergency Medicine | Admitting: Emergency Medicine

## 2019-08-03 DIAGNOSIS — E1159 Type 2 diabetes mellitus with other circulatory complications: Secondary | ICD-10-CM | POA: Insufficient documentation

## 2019-08-03 DIAGNOSIS — R932 Abnormal findings on diagnostic imaging of liver and biliary tract: Secondary | ICD-10-CM | POA: Diagnosis not present

## 2019-08-03 DIAGNOSIS — K5790 Diverticulosis of intestine, part unspecified, without perforation or abscess without bleeding: Secondary | ICD-10-CM

## 2019-08-03 DIAGNOSIS — K449 Diaphragmatic hernia without obstruction or gangrene: Secondary | ICD-10-CM | POA: Diagnosis not present

## 2019-08-03 DIAGNOSIS — Z87891 Personal history of nicotine dependence: Secondary | ICD-10-CM | POA: Insufficient documentation

## 2019-08-03 DIAGNOSIS — K760 Fatty (change of) liver, not elsewhere classified: Secondary | ICD-10-CM | POA: Diagnosis not present

## 2019-08-03 DIAGNOSIS — I10 Essential (primary) hypertension: Secondary | ICD-10-CM | POA: Insufficient documentation

## 2019-08-03 DIAGNOSIS — I2510 Atherosclerotic heart disease of native coronary artery without angina pectoris: Secondary | ICD-10-CM | POA: Insufficient documentation

## 2019-08-03 DIAGNOSIS — D863 Sarcoidosis of skin: Secondary | ICD-10-CM | POA: Insufficient documentation

## 2019-08-03 DIAGNOSIS — Z90710 Acquired absence of both cervix and uterus: Secondary | ICD-10-CM | POA: Insufficient documentation

## 2019-08-03 DIAGNOSIS — R109 Unspecified abdominal pain: Secondary | ICD-10-CM | POA: Diagnosis present

## 2019-08-03 DIAGNOSIS — Z79899 Other long term (current) drug therapy: Secondary | ICD-10-CM | POA: Insufficient documentation

## 2019-08-03 LAB — COMPREHENSIVE METABOLIC PANEL
ALT: 24 U/L (ref 0–44)
AST: 22 U/L (ref 15–41)
Albumin: 4.6 g/dL (ref 3.5–5.0)
Alkaline Phosphatase: 67 U/L (ref 38–126)
Anion gap: 8 (ref 5–15)
BUN: 12 mg/dL (ref 6–20)
CO2: 25 mmol/L (ref 22–32)
Calcium: 8.9 mg/dL (ref 8.9–10.3)
Chloride: 104 mmol/L (ref 98–111)
Creatinine, Ser: 0.55 mg/dL (ref 0.44–1.00)
GFR calc Af Amer: >60 mL/min (ref >60)
GFR calc non Af Amer: >60 mL/min (ref >60)
Glucose, Bld: 95 mg/dL (ref 70–99)
Potassium: 3.9 mmol/L (ref 3.5–5.1)
Sodium: 137 mmol/L (ref 135–145)
Total Bilirubin: 0.8 mg/dL (ref 0.3–1.2)
Total Protein: 8.6 g/dL — ABNORMAL HIGH (ref 6.5–8.1)

## 2019-08-03 LAB — CBC
HCT: 43.3 % (ref 36.0–46.0)
Hemoglobin: 14.6 g/dL (ref 12.0–15.0)
MCH: 28.6 pg (ref 26.0–34.0)
MCHC: 33.7 g/dL (ref 30.0–36.0)
MCV: 84.9 fL (ref 80.0–100.0)
Platelets: 310 10*3/uL (ref 150–400)
RBC: 5.1 MIL/uL (ref 3.87–5.11)
RDW: 12.9 % (ref 11.5–15.5)
WBC: 6.6 10*3/uL (ref 4.0–10.5)
nRBC: 0 % (ref 0.0–0.2)

## 2019-08-03 LAB — URINALYSIS, COMPLETE (UACMP) WITH MICROSCOPIC
Bacteria, UA: NONE SEEN
Bilirubin Urine: NEGATIVE
Glucose, UA: NEGATIVE mg/dL
Hgb urine dipstick: NEGATIVE
Ketones, ur: NEGATIVE mg/dL
Leukocytes,Ua: NEGATIVE
Nitrite: NEGATIVE
Protein, ur: 100 mg/dL — AB
Specific Gravity, Urine: 1.025 (ref 1.005–1.030)
pH: 6 (ref 5.0–8.0)

## 2019-08-03 LAB — LIPASE, BLOOD: Lipase: 25 U/L (ref 11–51)

## 2019-08-03 MED ORDER — HYDROCODONE-ACETAMINOPHEN 5-325 MG PO TABS
1.0000 | ORAL_TABLET | Freq: Once | ORAL | Status: AC
  Administered 2019-08-03: 1 via ORAL
  Filled 2019-08-03: qty 1

## 2019-08-03 MED ORDER — IOHEXOL 300 MG/ML  SOLN
100.0000 mL | Freq: Once | INTRAMUSCULAR | Status: AC | PRN
  Administered 2019-08-03: 100 mL via INTRAVENOUS
  Filled 2019-08-03: qty 100

## 2019-08-03 MED ORDER — DICYCLOMINE HCL 10 MG PO CAPS: 10.0000 mg | ORAL_CAPSULE | Freq: Three times a day (TID) | ORAL | 0 refills | Status: DC | PRN

## 2019-08-03 MED ORDER — CIPROFLOXACIN HCL 500 MG PO TABS
500.0000 mg | ORAL_TABLET | Freq: Two times a day (BID) | ORAL | 0 refills | Status: AC
Start: 2019-08-03 — End: 2019-08-10

## 2019-08-03 MED ORDER — DICYCLOMINE HCL 10 MG PO CAPS
10.0000 mg | ORAL_CAPSULE | Freq: Once | ORAL | Status: AC
  Administered 2019-08-03: 10 mg via ORAL
  Filled 2019-08-03: qty 1

## 2019-08-03 MED ORDER — METRONIDAZOLE 500 MG PO TABS: 500.0000 mg | ORAL_TABLET | Freq: Two times a day (BID) | ORAL | 0 refills | Status: AC

## 2019-08-03 NOTE — ED Triage Notes (Signed)
Pt via pov from home with abdominal pain today. Pt denies painful urination, endorses frequency. Pt states she has been put on metformin and thinks that may be the reason for the increased frequency. Pt denies any other symptoms, including n/v/d. Pt alert & oriented, nad noted.

## 2019-08-03 NOTE — ED Notes (Signed)
Pt reports feeling of "cramps"; reports history of hysterectomy. Pt took mag at home to help "clean" her "out". States did have liquid BM after this.

## 2019-08-03 NOTE — ED Triage Notes (Signed)
First Nurse Note:  Arrives from [place] for ED evaluation of lower abdominal pain.  Patient is AAOx3.  Skin warm and dry.  Posture upright and relaxed. NAD

## 2019-08-03 NOTE — ED Provider Notes (Signed)
[facility] Emergency Department Provider Note  ____________________________________________  Time seen: Approximately 5:06 PM  I have reviewed the triage vital signs and the nursing notes.   HISTORY  Chief Complaint Abdominal Pain    HPI Sheila Eaton is a 59 y.o. female that presents to the emergency department for evaluation of low central abdominal discomfort since 4 AM this morning.  Patient states that pain has actually been coming and going for several years.  She first noticed pain in this location 15 years ago after having a hysterectomy.  She was told that it was scar tissue.  Pain has become more frequent in the last couple of years.  Pain will come for several days and then disappear for several months.  She describes the discomfort as menstrual cramping.  She has come to the emergency department for pain before but has left due to long waits.  Patient had a couple episodes of diarrhea yesterday.  No nausea, vomiting, abdominal pain, diarrhea, constipation.   Past Medical History:  Diagnosis Date  . Collagen vascular disease ([facility])    lupus  . Hypertension   . Lupus ([place])     There are no problems to display for this patient.   Past Surgical History:  Procedure Laterality Date  . ABDOMINAL HYSTERECTOMY    . COLONOSCOPY WITH PROPOFOL N/A 10/21/2018   Procedure: COLONOSCOPY WITH PROPOFOL;  Surgeon: Jonathon Bellows, MD;  Location: [facility] ENDOSCOPY;  Service: Gastroenterology;  Laterality: N/A;  . LEG SURGERY    . VASCULAR SURGERY      Prior to Admission medications   Medication Sig Start Date End Date Taking? Authorizing Provider  atorvastatin (LIPITOR) 10 MG tablet Take 10 mg by mouth daily.    [provider]  ciprofloxacin (CIPRO) 500 MG tablet Take 1 tablet (500 mg total) by mouth 2 (two) times daily for 7 days. 08/03/19 08/10/19  Laban Emperor, PA-C  dicyclomine (BENTYL) 10 MG capsule Take 1 capsule (10 mg total) by mouth 3  (three) times daily as needed for up to 3 days for spasms. 08/03/19 08/06/19  Laban Emperor, PA-C  hydrochlorothiazide (HYDRODIURIL) 12.5 MG tablet Take 1 tablet (12.5 mg total) by mouth daily. 11/03/16   Rudene Re, MD  HYDROcodone-acetaminophen [facility]) 5-325 MG tablet Take 1 tablet by mouth every 4 (four) hours as needed for moderate pain. Patient not taking: Reported on 10/21/2018 05/17/17   Merlyn Lot, MD  METOPROLOL TARTRATE PO Take 10 mg by mouth.    [provider]  metroNIDAZOLE (FLAGYL) 500 MG tablet Take 1 tablet (500 mg total) by mouth 2 (two) times daily for 7 days. 08/03/19 08/10/19  Laban Emperor, PA-C    Allergies Other  Family History  Problem Relation Age of Onset  . Breast cancer Neg Hx     Social History Social History   Tobacco Use  . Smoking status: Former Research scientist (life sciences)  . Smokeless tobacco: Never Used  Substance Use Topics  . Alcohol use: Yes    Comment: occassional  . Drug use: No     Review of Systems  Constitutional: No fever/chills Cardiovascular: No chest pain. Respiratory: No cough. No SOB. Gastrointestinal: Positive for abdominal pain.  No nausea, no vomiting.  Genitourinary: Negative for dysuria. Musculoskeletal: Negative for musculoskeletal pain. Skin: Negative for rash, abrasions, lacerations, ecchymosis. Neurological: Negative for headaches  ____________________________________________   PHYSICAL EXAM:  VITAL SIGNS: ED Triage Vitals  Enc Vitals Group     BP 08/03/19 1303 (!) 161/97  Pulse Rate 08/03/19 1303 69     Resp 08/03/19 1303 18     Temp 08/03/19 1303 98.3 F (36.8 C)     Temp Source 08/03/19 1303 Oral     SpO2 08/03/19 1303 99 %     Weight 08/03/19 1304 233 lb (105.7 kg)     Height 08/03/19 1304 5\' 7"  (1.702 m)     Head Circumference --      Peak Flow --      Pain Score 08/03/19 1303 8     Pain Loc --      Pain Edu? --      Excl. in Hall? --      Constitutional: Alert and oriented. Well appearing  and in no acute distress. Eyes: Conjunctivae are normal. PERRL. EOMI. Head: Atraumatic. ENT:      Ears:      Nose: No congestion/rhinnorhea.      Mouth/Throat: Mucous membranes are moist.  Neck: No stridor.  Cardiovascular: Normal rate, regular rhythm.  Good peripheral circulation. Respiratory: Normal respiratory effort without tachypnea or retractions. Lungs CTAB. Good air entry to the bases with no decreased or absent breath sounds. Gastrointestinal: Bowel sounds 4 quadrants. Soft and nontender to palpation. No guarding or rigidity. No palpable masses. No distention. No CVA tenderness. Musculoskeletal: Full range of motion to all extremities. No gross deformities appreciated. Neurologic:  Normal speech and language. No gross focal neurologic deficits are appreciated.  Skin:  Skin is warm, dry and intact. No rash noted. Psychiatric: Mood and affect are normal. Speech and behavior are normal. Patient exhibits appropriate insight and judgement.   ____________________________________________   LABS (all labs ordered are listed, but only abnormal results are displayed)  Labs Reviewed  COMPREHENSIVE METABOLIC PANEL - Abnormal; Notable for the following components:      Result Value   Total Protein 8.6 (*)    All other components within normal limits  URINALYSIS, COMPLETE (UACMP) WITH MICROSCOPIC - Abnormal; Notable for the following components:   Color, Urine YELLOW (*)    APPearance HAZY (*)    Protein, ur 100 (*)    All other components within normal limits  LIPASE, BLOOD  CBC   ____________________________________________  EKG   ____________________________________________  RADIOLOGY   CT ABDOMEN PELVIS W CONTRAST  Result Date: 08/03/2019 CLINICAL DATA:  Pelvic pain EXAM: CT ABDOMEN AND PELVIS WITH CONTRAST TECHNIQUE: Multidetector CT imaging of the abdomen and pelvis was performed using the standard protocol following bolus administration of intravenous contrast.  CONTRAST:  164mL OMNIPAQUE IOHEXOL 300 MG/ML  SOLN COMPARISON:  CT angiography of the chest 05/17/2017 FINDINGS: Lower chest: Atelectatic changes are noted in the lung bases more focal area of scarring or subsegmental atelectasis seen in the medial right cardiophrenic sulcus. Cardiac size is upper limits normal with mild biatrial enlargement. Coronary artery calcifications are present. No pericardial effusion. Hepatobiliary: Diffuse hepatic hypoattenuation compatible with hepatic steatosis. Focal sparing along the gallbladder fossa. Ill-defined region of hyperattenuation seen in segment 7/8 with central hypoattenuating focus and small linear hypoattenuating band which could reflect an unopacified vessel. Incompletely characterized on this exam. No other focal liver abnormality is seen. Normal gallbladder and biliary tree. Pancreas: Unremarkable. No pancreatic ductal dilatation or surrounding inflammatory changes. Spleen: Normal in size without focal abnormality. Adrenals/Urinary Tract: Normal adrenal glands. Kidneys are normally located with symmetric enhancement and excretion. No suspicious renal lesion, urolithiasis or hydronephrosis. Urinary bladder is unremarkable. Stomach/Bowel: Small hiatal hernia. Distal stomach and duodenum are unremarkable. No small bowel  thickening or dilatation. A normal appendix is visualized. No proximal colonic dilatation or wall thickening. There is some mild segmental thickening of the sigmoid colon in a region of numerous colonic diverticula but without focal diverticular inflammation. No evidence of bowel obstruction. Vascular/Lymphatic: Atherosclerotic plaque throughout the aorta and branch vessels. No aneurysm or ectasia. Major venous structures are unremarkable. No enlarged abdominopelvic nodes. Reproductive: Uterus is surgically absent. No concerning adnexal lesions. Other: No abdominopelvic free fluid or free gas. No bowel containing hernias. Postsurgical changes of prior low  vertical midline incision. Musculoskeletal: Multilevel degenerative changes are present in the imaged portions of the spine. Mild degenerative changes in the hips and SI joints. No acute osseous abnormality or suspicious osseous lesion. IMPRESSION: 1. There is some mild segmental thickening of the sigmoid colon in a region of numerous colonic diverticula but without focal diverticular inflammation. Findings may represent a mild colitis, sequela of prior diverticulitis or smoldering inflammation such as with segmental colitis associated with diverticulosis (SCAD). Recommend outpatient correlation following resolution of acute symptoms with direct visualization to exclude the possibility of an underlying lesion. 2. Ill-defined region of hyperattenuation in segment 7/8 with central hypoattenuating focus and small linear hypoattenuating band which could reflect an unopacified vessel. Could reflect a transient hepatic attenuation difference or possible small arterial portal shunt. Incompletely characterized on this exam. Consider further evaluation with nonemergent liver protocol MRI. 3. Hepatic steatosis. 4. Small hiatal hernia. 5. Coronary artery calcifications are present. Please note that the presence of coronary artery calcium documents the presence of coronary artery disease, the severity of this disease and any potential stenosis cannot be assessed on this non-gated CT examination. Assessment for potential risk factor modification, dietary therapy or pharmacologic therapy may be warranted. 6. Aortic Atherosclerosis (ICD10-I70.0). Electronically Signed   By: Lovena Le M.D.   On: 08/03/2019 18:21    ____________________________________________    PROCEDURES  Procedure(s) performed:    Procedures    Medications  iohexol (OMNIPAQUE) 300 MG/ML solution 100 mL (100 mLs Intravenous Contrast Given 08/03/19 1747)  dicyclomine (BENTYL) capsule 10 mg (10 mg Oral Given 08/03/19 1911)   HYDROcodone-acetaminophen (NORCO/VICODIN) 5-325 MG per tablet 1 tablet (1 tablet Oral Given 08/03/19 1911)     ____________________________________________   INITIAL IMPRESSION / ASSESSMENT AND PLAN / ED COURSE  Pertinent labs & imaging results that were available during my care of the patient were reviewed by me and considered in my medical decision making (see chart for details).  Review of the [place] CSRS was performed in accordance of the [place] prior to dispensing any controlled drugs.   Patient's diagnosis is consistent with diverticulosis. Vital signs and exam are reassuring. Labwork largely unremarkable. CT scan finding showing multiple colonic diverticula without focal diverticular inflammation.  Patient recalls having a colonoscopy last fall and being told that she has diverticulosis.  See additional CT scan findings above.  CT scan findings reviewed with Dr. Ellender Hose.  All CT findings were discussed with the patient.  Patient will be started on Flagyl and Cipro for diverticulosis.  Patient was given Vicodin and Bentyl for pain.  Patient will be discharged home with prescriptions for Cipro, Flagyl, Bentyl. Patient is to follow up with GI as directed.  Referral was given.  Patient is given ED precautions to return to the ED for any worsening or new symptoms.  Sheila Eaton was evaluated in Emergency Department on 08/03/2019 for the symptoms described in the history of present illness. She was evaluated in the context of  the global COVID-19 pandemic, which necessitated consideration that the patient might be at risk for infection with the SARS-CoV-2 virus that causes COVID-19. Institutional protocols and algorithms that pertain to the evaluation of patients at risk for COVID-19 are in a state of rapid change based on information released by regulatory bodies including the CDC and federal and state organizations. These policies and algorithms were followed during the patient's care in the  ED.   ____________________________________________  FINAL CLINICAL IMPRESSION(S) / ED DIAGNOSES  Final diagnoses:  Diverticulosis  Abnormal finding on imaging of liver  Hepatic steatosis  Hiatal hernia  Coronary artery disease without angina pectoris, unspecified vessel or lesion type, unspecified whether native or transplanted heart      NEW MEDICATIONS STARTED DURING THIS VISIT:  ED Discharge Orders         Ordered    ciprofloxacin (CIPRO) 500 MG tablet  2 times daily     08/03/19 2005    metroNIDAZOLE (FLAGYL) 500 MG tablet  2 times daily     08/03/19 2005    dicyclomine (BENTYL) 10 MG capsule  3 times daily PRN     08/03/19 2005              This chart was dictated using voice recognition software/Dragon. Despite best efforts to proofread, errors can occur which can change the meaning. Any change was purely unintentional.    Laban Emperor, PA-C 08/03/19 2248    Duffy Bruce, MD 08/04/19 2047

## 2019-08-07 ENCOUNTER — Other Ambulatory Visit: Payer: Self-pay

## 2019-08-08 ENCOUNTER — Other Ambulatory Visit: Payer: Self-pay

## 2019-08-08 ENCOUNTER — Ambulatory Visit (INDEPENDENT_AMBULATORY_CARE_PROVIDER_SITE_OTHER): Payer: BC Managed Care – PPO | Admitting: Gastroenterology

## 2019-08-08 ENCOUNTER — Encounter: Payer: Self-pay | Admitting: Gastroenterology

## 2019-08-08 VITALS — BP 133/88 | HR 65 | Temp 98.1°F | Ht 67.0 in | Wt 235.4 lb

## 2019-08-08 DIAGNOSIS — R197 Diarrhea, unspecified: Secondary | ICD-10-CM | POA: Diagnosis not present

## 2019-08-08 DIAGNOSIS — R935 Abnormal findings on diagnostic imaging of other abdominal regions, including retroperitoneum: Secondary | ICD-10-CM

## 2019-08-08 NOTE — Progress Notes (Signed)
Jonathon Bellows MD, MRCP(U.K) [address]  [place]  [place], [place] [address]  Main: [phone]  Fax: [phone]   Gastroenterology Consultation  Referring Provider:     Casilda Carls, MD Primary Care Physician:  Casilda Carls, MD Primary Gastroenterologist:  Dr. Jonathon Bellows  Reason for Consultation:     Diverticulosis of the colon         HPI:   Sheila Eaton is a 59 y.o. y/o female referred for consultation & management  by Dr. Casilda Carls, MD.    [phone]: Colonoscopy: Diverticulosis  the colon.  3 hyperplastic polyps were resected in the rectosigmoid area.  She presented to the ER on 08/03/2019 with abdominal pain.  Central.  Ongoing for several years.Underwent a CT scan of the abdomen pelvis.  Some mild segmental thickening of the sigmoid colon in the region of numerous colonic diverticula but without inflammation.  Abnormal hyperattenuation blood vessels.  In the region of the liver.  Just to obtain his liver MRI protocol.  Small hiatal hernia.  Hemoglobin 14.6 g.  CMP was normal.  Given a course of ciprofloxacin and Flagyl.  Treated with Bentyl for pain along with Vicodin and discharged to follow-up with GI.  She states that she presently has absolutely no abdominal pain but has been having diarrhea.  No other complaints.  She suffers with some acid reflux.  Past Medical History:  Diagnosis Date  . Collagen vascular disease (HCC)    lupus  . Hypertension   . Lupus [facility])     Past Surgical History:  Procedure Laterality Date  . ABDOMINAL HYSTERECTOMY    . COLONOSCOPY WITH PROPOFOL N/A 10/21/2018   Procedure: COLONOSCOPY WITH PROPOFOL;  Surgeon: Jonathon Bellows, MD;  Location: [facility] ENDOSCOPY;  Service: Gastroenterology;  Laterality: N/A;  . LEG SURGERY    . VASCULAR SURGERY      Prior to Admission medications   Medication Sig Start Date End Date Taking? Authorizing Provider  ciprofloxacin (CIPRO) 500 MG tablet Take 1 tablet (500 mg total) by mouth 2  (two) times daily for 7 days. 08/03/19 08/10/19 Yes Laban Emperor, PA-C  dicyclomine (BENTYL) 10 MG capsule Take 1 capsule (10 mg total) by mouth 3 (three) times daily as needed for up to 3 days for spasms. 08/03/19 08/08/19 Yes Laban Emperor, PA-C  fluticasone Asencion Islam) 50 MCG/ACT nasal spray SMARTSIG:2 Spray(s) Both Nares Daily PRN 04/12/19  Yes [provider]  hydrochlorothiazide (HYDRODIURIL) 25 MG tablet Take by mouth. 10/18/18  Yes [provider]  hydroxychloroquine (PLAQUENIL) 200 MG tablet 1 tab twice a day for lupus , 180 tabs for 90 day 05/03/19  Yes [provider]  metoprolol tartrate (LOPRESSOR) 25 MG tablet Take 25 mg by mouth 2 (two) times daily. 06/08/19  Yes [provider]  metroNIDAZOLE (FLAGYL) 500 MG tablet Take 1 tablet (500 mg total) by mouth 2 (two) times daily for 7 days. 08/03/19 08/10/19 Yes Laban Emperor, PA-C  valsartan (DIOVAN) 320 MG tablet Take 320 mg by mouth daily. 06/08/19  Yes [provider]    Family History  Problem Relation Age of Onset  . Breast cancer Neg Hx      Social History   Tobacco Use  . Smoking status: Former Research scientist (life sciences)  . Smokeless tobacco: Never Used  Substance Use Topics  . Alcohol use: Yes    Comment: occassional  . Drug use: No    Allergies as of 08/08/2019 - Review Complete 08/08/2019  Allergen Reaction Noted  .  Other  11/03/2016    Review of Systems:    All systems reviewed and negative except where noted in HPI.   Physical Exam:  BP 133/88 (BP Location: Left Arm, Patient Position: Sitting, Cuff Size: Normal)   Pulse 65   Temp 98.1 F (36.7 C) (Oral)   Ht 5\' 7"  (1.702 m)   Wt 235 lb 6 oz (106.8 kg)   BMI 36.86 kg/m  No LMP recorded. Patient has had a hysterectomy. Psych:  Alert and cooperative. Normal mood and affect. General:   Alert,  Well-developed, well-nourished, pleasant and cooperative in NAD Abdomen:  Normal bowel sounds.  No bruits.  Soft, non-tender and non-distended  without masses, hepatosplenomegaly or hernias noted.  No guarding or rebound tenderness.    Neurologic:  Alert and oriented x3;  grossly normal neurologically. Psych:  Alert and cooperative. Normal mood and affect.  Imaging Studies: CT ABDOMEN PELVIS W CONTRAST  Result Date: 08/03/2019 CLINICAL DATA:  Pelvic pain EXAM: CT ABDOMEN AND PELVIS WITH CONTRAST TECHNIQUE: Multidetector CT imaging of the abdomen and pelvis was performed using the standard protocol following bolus administration of intravenous contrast. CONTRAST:  158mL OMNIPAQUE IOHEXOL 300 MG/ML  SOLN COMPARISON:  CT angiography of the chest 05/17/2017 FINDINGS: Lower chest: Atelectatic changes are noted in the lung bases more focal area of scarring or subsegmental atelectasis seen in the medial right cardiophrenic sulcus. Cardiac size is upper limits normal with mild biatrial enlargement. Coronary artery calcifications are present. No pericardial effusion. Hepatobiliary: Diffuse hepatic hypoattenuation compatible with hepatic steatosis. Focal sparing along the gallbladder fossa. Ill-defined region of hyperattenuation seen in segment 7/8 with central hypoattenuating focus and small linear hypoattenuating band which could reflect an unopacified vessel. Incompletely characterized on this exam. No other focal liver abnormality is seen. Normal gallbladder and biliary tree. Pancreas: Unremarkable. No pancreatic ductal dilatation or surrounding inflammatory changes. Spleen: Normal in size without focal abnormality. Adrenals/Urinary Tract: Normal adrenal glands. Kidneys are normally located with symmetric enhancement and excretion. No suspicious renal lesion, urolithiasis or hydronephrosis. Urinary bladder is unremarkable. Stomach/Bowel: Small hiatal hernia. Distal stomach and duodenum are unremarkable. No small bowel thickening or dilatation. A normal appendix is visualized. No proximal colonic dilatation or wall thickening. There is some mild segmental  thickening of the sigmoid colon in a region of numerous colonic diverticula but without focal diverticular inflammation. No evidence of bowel obstruction. Vascular/Lymphatic: Atherosclerotic plaque throughout the aorta and branch vessels. No aneurysm or ectasia. Major venous structures are unremarkable. No enlarged abdominopelvic nodes. Reproductive: Uterus is surgically absent. No concerning adnexal lesions. Other: No abdominopelvic free fluid or free gas. No bowel containing hernias. Postsurgical changes of prior low vertical midline incision. Musculoskeletal: Multilevel degenerative changes are present in the imaged portions of the spine. Mild degenerative changes in the hips and SI joints. No acute osseous abnormality or suspicious osseous lesion. IMPRESSION: 1. There is some mild segmental thickening of the sigmoid colon in a region of numerous colonic diverticula but without focal diverticular inflammation. Findings may represent a mild colitis, sequela of prior diverticulitis or smoldering inflammation such as with segmental colitis associated with diverticulosis (SCAD). Recommend outpatient correlation following resolution of acute symptoms with direct visualization to exclude the possibility of an underlying lesion. 2. Ill-defined region of hyperattenuation in segment 7/8 with central hypoattenuating focus and small linear hypoattenuating band which could reflect an unopacified vessel. Could reflect a transient hepatic attenuation difference or possible small arterial portal shunt. Incompletely characterized on this exam. Consider further evaluation with nonemergent  liver protocol MRI. 3. Hepatic steatosis. 4. Small hiatal hernia. 5. Coronary artery calcifications are present. Please note that the presence of coronary artery calcium documents the presence of coronary artery disease, the severity of this disease and any potential stenosis cannot be assessed on this non-gated CT examination. Assessment for  potential risk factor modification, dietary therapy or pharmacologic therapy may be warranted. 6. Aortic Atherosclerosis (ICD10-I70.0). Electronically Signed   By: Lovena Le M.D.   On: 08/03/2019 18:21    Assessment and Plan:   Sheila Eaton is a 59 y.o. y/o female recently present to the ER with abdominal pain.  CT scan of the abdomen demonstrated mild abnormalities in the region of the sigmoid colon.  No acute inflammation.  Treated and diverticulitis in center.  CT scan suggested direct visualization.  Normal colonoscopy less than a year back.  Patient is here with a slightly more prominent sigmoid colon on recent CT scan.  At that time she had abdominal pain.  Presently completely resolved.  She also haso prominent blood vessels seen in the liver with some hyperattenuation.  Unclear of the etiology.  Presently has some diarrhea which I think is related to the antibiotics.  There was no clear reason or indication for antibiotics to be given during the ER visit.  There is no clear evidence of diverticulitis hence I believe it is safe to start antibiotics.  Plan 1.  Flexible sigmoidoscopy in 6 weeks to examine the sigmoid colon. 2.  MRI of the liver to evaluate abnormal appearing area. 3.  Stop all antibiotic use.  I will call her in a week to check how she is doing.  If diarrhea persist then will need stool studies to rule out C. difficile diarrhea. 4.  Follow-up in 8 weeks after MRI  I have discussed alternative options, risks & benefits,  which include, but are not limited to, bleeding, infection, perforation,respiratory complication & drug reaction.  The patient agrees with this plan & written consent will be obtained.     Follow up in 1 week telephone visit  Dr Jonathon Bellows MD,MRCP(U.K)

## 2019-08-08 NOTE — Progress Notes (Deleted)
Sheila Darby, MD [address]  [place]  [place], [place] [address]  Main: [phone]  Fax: [phone]    Gastroenterology Consultation  Referring Provider:     Casilda Carls, MD Primary Care Physician:  Casilda Carls, MD Primary Gastroenterologist:  Dr. Cephas Eaton Reason for Consultation:     Sigmoid diverticulitis        HPI:   Sheila Eaton is a 59 y.o. female referred by Dr. Casilda Carls, MD  for consultation & management of ***  NSAIDs: ***  Antiplts/Anticoagulants/Anti thrombotics: ***  GI Procedures:  Colonoscopy 10/21/2018 - Diverticulosis in the entire examined colon. - Three 3 to 4 mm polyps at the recto-sigmoid colon, removed with a cold snare. Resected and retrieved. - The examination was otherwise normal on direct and retroflexion views. DIAGNOSIS:  A. COLON POLYP X3, RECTOSIGMOID; COLD SNARE:  - HYPERPLASTIC POLYP, 3 FRAGMENTS.  - NEGATIVE FOR DYSPLASIA AND MALIGNANCY.    Past Medical History:  Diagnosis Date  . Collagen vascular disease (HCC)    lupus  . Hypertension   . Lupus [facility])     Past Surgical History:  Procedure Laterality Date  . ABDOMINAL HYSTERECTOMY    . COLONOSCOPY WITH PROPOFOL N/A 10/21/2018   Procedure: COLONOSCOPY WITH PROPOFOL;  Surgeon: Jonathon Bellows, MD;  Location: [facility] ENDOSCOPY;  Service: Gastroenterology;  Laterality: N/A;  . LEG SURGERY    . VASCULAR SURGERY      Prior to Admission medications   Medication Sig Start Date End Date Taking? Authorizing Provider  ciprofloxacin (CIPRO) 500 MG tablet Take 1 tablet (500 mg total) by mouth 2 (two) times daily for 7 days. 08/03/19 08/10/19 Yes Laban Emperor, PA-C  dicyclomine (BENTYL) 10 MG capsule Take 1 capsule (10 mg total) by mouth 3 (three) times daily as needed for up to 3 days for spasms. 08/03/19 08/08/19 Yes Laban Emperor, PA-C  fluticasone Asencion Islam) 50 MCG/ACT nasal spray SMARTSIG:2 Spray(s) Both Nares Daily PRN 04/12/19  Yes [provider]  hydrochlorothiazide (HYDRODIURIL) 25 MG tablet Take by mouth. 10/18/18  Yes [provider]  hydroxychloroquine (PLAQUENIL) 200 MG tablet 1 tab twice a day for lupus , 180 tabs for 90 day 05/03/19  Yes [provider]  metoprolol tartrate (LOPRESSOR) 25 MG tablet Take 25 mg by mouth 2 (two) times daily. 06/08/19  Yes [provider]  metroNIDAZOLE (FLAGYL) 500 MG tablet Take 1 tablet (500 mg total) by mouth 2 (two) times daily for 7 days. 08/03/19 08/10/19 Yes Laban Emperor, PA-C  valsartan (DIOVAN) 320 MG tablet Take 320 mg by mouth daily. 06/08/19  Yes [provider]    Family History  Problem Relation Age of Onset  . Breast cancer Neg Hx      Social History   Tobacco Use  . Smoking status: Former Research scientist (life sciences)  . Smokeless tobacco: Never Used  Substance Use Topics  . Alcohol use: Yes    Comment: occassional  . Drug use: No    Allergies as of 08/08/2019 - Review Complete 08/08/2019  Allergen Reaction Noted  . Other  11/03/2016    Review of Systems:    All systems reviewed and negative except where noted in HPI.   Physical Exam:  BP 133/88 (BP Location: Left Arm, Patient Position: Sitting, Cuff Size: Normal)   Pulse 65   Temp 98.1 F (36.7 C) (Oral)   Ht 5\' 7"  (1.702 m)   Wt 235 lb 6 oz (106.8 kg)   BMI 36.86 kg/m  No LMP recorded. Patient has had a hysterectomy.  General:   Alert,  Well-developed, well-nourished, pleasant and cooperative in NAD Head:  Normocephalic and atraumatic. Eyes:  Sclera clear, no icterus.   Conjunctiva pink. Ears:  Normal auditory acuity. Nose:  No deformity, discharge, or lesions. Mouth:  No deformity or lesions,oropharynx pink & moist. Neck:  Supple; no masses or thyromegaly. Lungs:  Respirations even and unlabored.  Clear throughout to auscultation.   No wheezes, crackles, or rhonchi. No acute distress. Heart:  Regular rate and rhythm; no murmurs, clicks, rubs, or gallops. Abdomen:  Normal bowel  sounds. Soft, non-tender and non-distended without masses, hepatosplenomegaly or hernias noted.  No guarding or rebound tenderness.   Rectal: Not performed Msk:  Symmetrical without gross deformities. Good, equal movement & strength bilaterally. Pulses:  Normal pulses noted. Extremities:  No clubbing or edema.  No cyanosis. Neurologic:  Alert and oriented x3;  grossly normal neurologically. Skin:  Intact without significant lesions or rashes. No jaundice. Lymph Nodes:  No significant cervical adenopathy. Psych:  Alert and cooperative. Normal mood and affect.  Imaging Studies: ***  Assessment and Plan:   Sheila Eaton is a 59 y.o. female with ***   Follow up in ***   Sheila Darby, MD

## 2019-08-10 ENCOUNTER — Telehealth: Payer: Self-pay

## 2019-08-10 DIAGNOSIS — R933 Abnormal findings on diagnostic imaging of other parts of digestive tract: Secondary | ICD-10-CM

## 2019-08-10 DIAGNOSIS — R197 Diarrhea, unspecified: Secondary | ICD-10-CM

## 2019-08-10 NOTE — Telephone Encounter (Signed)
Called and left a message for call back to scheduled patient for MRI of liver  and Flex sigmoid.  Flexible sigmoidoscopy in 6 weeks to examine the sigmoid colon. 2.  MRI of the liver to evaluate abnormal appearing area.

## 2019-08-15 ENCOUNTER — Other Ambulatory Visit: Payer: Self-pay

## 2019-08-15 DIAGNOSIS — R935 Abnormal findings on diagnostic imaging of other abdominal regions, including retroperitoneum: Secondary | ICD-10-CM

## 2019-08-15 DIAGNOSIS — R197 Diarrhea, unspecified: Secondary | ICD-10-CM

## 2019-08-15 NOTE — Telephone Encounter (Signed)
Patient verbalized understanding of MRI date

## 2019-08-15 NOTE — Addendum Note (Signed)
Addended by: Ulyess Blossom L on: 08/15/2019 10:28 AM   Modules accepted: Orders

## 2019-08-15 NOTE — Telephone Encounter (Signed)
Patient scheduled flex sigmoid for 09/22/2019 with Dr. Vicente Males. Went over instructions with patient and mailed them.  Patient would like to do the MRI on a Wednesday or Friday. Called and scheduled MRI for 08/30/2019 [facility] 7:30[facility]. Nothing to eat or drink after midnight.  Called patient to inform patient of the MRI date. Had to leave a detail message for call back

## 2019-08-28 ENCOUNTER — Telehealth: Payer: Self-pay | Admitting: Gastroenterology

## 2019-08-28 NOTE — Telephone Encounter (Signed)
08/28/19~LM on VM. MF

## 2019-08-30 ENCOUNTER — Ambulatory Visit: Payer: BC Managed Care – PPO

## 2019-09-12 ENCOUNTER — Other Ambulatory Visit: Payer: Self-pay

## 2019-09-12 ENCOUNTER — Ambulatory Visit
Admission: RE | Admit: 2019-09-12 | Discharge: 2019-09-12 | Disposition: A | Discharge status: Home / Self Care | Payer: BC Managed Care – PPO | Source: Ambulatory Visit | Attending: Gastroenterology | Admitting: Gastroenterology

## 2019-09-12 DIAGNOSIS — R933 Abnormal findings on diagnostic imaging of other parts of digestive tract: Secondary | ICD-10-CM | POA: Diagnosis present

## 2019-09-12 DIAGNOSIS — R197 Diarrhea, unspecified: Secondary | ICD-10-CM | POA: Diagnosis present

## 2019-09-12 MED ORDER — GADOBUTROL 1 MMOL/ML IV SOLN
10.0000 mL | Freq: Once | INTRAVENOUS | Status: AC | PRN
  Administered 2019-09-12: 10 mL via INTRAVENOUS

## 2019-09-20 ENCOUNTER — Other Ambulatory Visit
Admission: RE | Admit: 2019-09-20 | Discharge: 2019-09-20 | Disposition: A | Discharge status: Home / Self Care | Payer: BC Managed Care – PPO | Source: Ambulatory Visit | Attending: Gastroenterology | Admitting: Gastroenterology

## 2019-09-20 ENCOUNTER — Other Ambulatory Visit: Payer: Self-pay

## 2019-09-20 DIAGNOSIS — Z01812 Encounter for preprocedural laboratory examination: Secondary | ICD-10-CM | POA: Insufficient documentation

## 2019-09-20 DIAGNOSIS — Z20822 Contact with and (suspected) exposure to covid-19: Secondary | ICD-10-CM | POA: Insufficient documentation

## 2019-09-20 LAB — SARS CORONAVIRUS 2 (TAT 6-24 HRS): SARS Coronavirus 2: NEGATIVE

## 2019-09-21 ENCOUNTER — Encounter: Payer: Self-pay | Admitting: Gastroenterology

## 2019-09-22 ENCOUNTER — Ambulatory Visit
Admission: RE | Admit: 2019-09-22 | Discharge: 2019-09-22 | Disposition: A | Discharge status: Home / Self Care | Payer: BC Managed Care – PPO | Attending: Gastroenterology | Admitting: Gastroenterology

## 2019-09-22 ENCOUNTER — Ambulatory Visit: Payer: BC Managed Care – PPO | Admitting: Anesthesiology

## 2019-09-22 ENCOUNTER — Other Ambulatory Visit: Payer: Self-pay

## 2019-09-22 ENCOUNTER — Encounter
Admission: RE | Disposition: A | Discharge status: Home / Self Care | Payer: Self-pay | Source: Home / Self Care | Attending: Gastroenterology

## 2019-09-22 ENCOUNTER — Encounter: Payer: Self-pay | Admitting: Gastroenterology

## 2019-09-22 DIAGNOSIS — Z87891 Personal history of nicotine dependence: Secondary | ICD-10-CM | POA: Insufficient documentation

## 2019-09-22 DIAGNOSIS — R197 Diarrhea, unspecified: Secondary | ICD-10-CM

## 2019-09-22 DIAGNOSIS — E119 Type 2 diabetes mellitus without complications: Secondary | ICD-10-CM | POA: Insufficient documentation

## 2019-09-22 DIAGNOSIS — K5730 Diverticulosis of large intestine without perforation or abscess without bleeding: Secondary | ICD-10-CM | POA: Insufficient documentation

## 2019-09-22 DIAGNOSIS — D125 Benign neoplasm of sigmoid colon: Secondary | ICD-10-CM | POA: Diagnosis not present

## 2019-09-22 DIAGNOSIS — Z79899 Other long term (current) drug therapy: Secondary | ICD-10-CM | POA: Diagnosis not present

## 2019-09-22 DIAGNOSIS — K635 Polyp of colon: Secondary | ICD-10-CM | POA: Insufficient documentation

## 2019-09-22 DIAGNOSIS — J449 Chronic obstructive pulmonary disease, unspecified: Secondary | ICD-10-CM | POA: Diagnosis not present

## 2019-09-22 DIAGNOSIS — Z7984 Long term (current) use of oral hypoglycemic drugs: Secondary | ICD-10-CM | POA: Insufficient documentation

## 2019-09-22 DIAGNOSIS — I10 Essential (primary) hypertension: Secondary | ICD-10-CM | POA: Diagnosis not present

## 2019-09-22 DIAGNOSIS — R933 Abnormal findings on diagnostic imaging of other parts of digestive tract: Secondary | ICD-10-CM | POA: Diagnosis not present

## 2019-09-22 DIAGNOSIS — R935 Abnormal findings on diagnostic imaging of other abdominal regions, including retroperitoneum: Secondary | ICD-10-CM | POA: Diagnosis not present

## 2019-09-22 DIAGNOSIS — M329 Systemic lupus erythematosus, unspecified: Secondary | ICD-10-CM | POA: Diagnosis not present

## 2019-09-22 HISTORY — DX: Type 2 diabetes mellitus without complications: E11.9

## 2019-09-22 HISTORY — PX: FLEXIBLE SIGMOIDOSCOPY: SHX5431

## 2019-09-22 LAB — GLUCOSE, CAPILLARY: Glucose-Capillary: 94 mg/dL (ref 70–99)

## 2019-09-22 SURGERY — SIGMOIDOSCOPY, FLEXIBLE
Anesthesia: General

## 2019-09-22 MED ORDER — SODIUM CHLORIDE 0.9 % IV SOLN: INTRAVENOUS | Status: DC

## 2019-09-22 MED ORDER — LIDOCAINE HCL (CARDIAC) PF 100 MG/5ML IV SOSY
PREFILLED_SYRINGE | INTRAVENOUS | Status: DC | PRN
  Administered 2019-09-22: 50 mg via INTRAVENOUS

## 2019-09-22 MED ORDER — PROPOFOL 500 MG/50ML IV EMUL
INTRAVENOUS | Status: AC
  Filled 2019-09-22: qty 50

## 2019-09-22 MED ORDER — PROPOFOL 500 MG/50ML IV EMUL
INTRAVENOUS | Status: DC | PRN
  Administered 2019-09-22: 150 ug/kg/min via INTRAVENOUS

## 2019-09-22 MED ORDER — PROPOFOL 10 MG/ML IV BOLUS
INTRAVENOUS | Status: DC | PRN
  Administered 2019-09-22: 80 mg via INTRAVENOUS

## 2019-09-22 NOTE — Anesthesia Procedure Notes (Signed)
Date/Time: 09/22/2019 10:16 AM Performed by: Johnna Acosta, CRNA Pre-anesthesia Checklist: Patient identified, Emergency Drugs available, Suction available, Patient being monitored and Timeout performed Patient Re-evaluated:Patient Re-evaluated prior to induction Oxygen Delivery Method: Nasal cannula Preoxygenation: Pre-oxygenation with 100% oxygen Induction Type: IV induction

## 2019-09-22 NOTE — Anesthesia Postprocedure Evaluation (Signed)
Anesthesia Post Note  Patient: Sheila Eaton  Procedure(s) Performed: FLEXIBLE SIGMOIDOSCOPY (N/A )  Patient location during evaluation: Endoscopy Anesthesia Type: General Level of consciousness: awake and alert Pain management: pain level controlled Vital Signs Assessment: post-procedure vital signs reviewed and stable Respiratory status: spontaneous breathing, nonlabored ventilation, respiratory function stable and patient connected to nasal cannula oxygen Cardiovascular status: blood pressure returned to baseline and stable Postop Assessment: no apparent nausea or vomiting Anesthetic complications: no   No complications documented.   Last Vitals:  Vitals:   09/22/19 1037 09/22/19 1038  BP: (!) 108/56 (!) 108/56  Pulse: 66 64  Resp: 10 18  Temp:    SpO2: 99% 99%    Last Pain:  Vitals:   09/22/19 1037  TempSrc:   PainSc: 0-No pain                 Arita Miss

## 2019-09-22 NOTE — H&P (Signed)
Jonathon Bellows, MD [address] [place], [place], [place], [address] [address], [place], [place], [place], [address] Phone: [phone]  Fax: [phone]  Primary Care Physician:  Casilda Carls, MD   Pre-Procedure History & Physical: HPI:  Sheila Eaton is a 59 y.o. female is here for a sigmoidoscopy   Past Medical History:  Diagnosis Date  . Collagen vascular disease (HCC)    lupus  . Diabetes mellitus without complication ([place])   . Hypertension   . Lupus [facility])     Past Surgical History:  Procedure Laterality Date  . ABDOMINAL HYSTERECTOMY    . COLONOSCOPY WITH PROPOFOL N/A 10/21/2018   Procedure: COLONOSCOPY WITH PROPOFOL;  Surgeon: Jonathon Bellows, MD;  Location: [facility] ENDOSCOPY;  Service: Gastroenterology;  Laterality: N/A;  . LEG SURGERY    . VASCULAR SURGERY      Prior to Admission medications   Medication Sig Start Date End Date Taking? Authorizing Provider  hydrochlorothiazide (HYDRODIURIL) 25 MG tablet Take by mouth. 10/18/18  Yes [provider]  hydroxychloroquine (PLAQUENIL) 200 MG tablet 1 tab twice a day for lupus , 180 tabs for 90 day 05/03/19  Yes [provider]  metFORMIN (GLUCOPHAGE) 500 MG tablet Take by mouth daily.   Yes [provider]  metoprolol tartrate (LOPRESSOR) 25 MG tablet Take 25 mg by mouth 2 (two) times daily. 06/08/19  Yes [provider]  dicyclomine (BENTYL) 10 MG capsule Take 1 capsule (10 mg total) by mouth 3 (three) times daily as needed for up to 3 days for spasms. 08/03/19 08/08/19  Laban Emperor, PA-C  fluticasone (FLONASE) 50 MCG/ACT nasal spray SMARTSIG:2 Spray(s) Both Nares Daily PRN 04/12/19   [provider]  valsartan (DIOVAN) 320 MG tablet Take 320 mg by mouth daily. Patient not taking: Reported on 09/22/2019 06/08/19   [provider]    Allergies as of 08/15/2019 - Review Complete 08/08/2019  Allergen Reaction Noted  . Other  11/03/2016    Family History   Problem Relation Age of Onset  . Breast cancer Neg Hx     Social History   Socioeconomic History  . Marital status: Married    Spouse name: Not on file  . Number of children: Not on file  . Years of education: Not on file  . Highest education level: Not on file  Occupational History  . Not on file  Tobacco Use  . Smoking status: Former Research scientist (life sciences)  . Smokeless tobacco: Never Used  Vaping Use  . Vaping Use: Never used  Substance and Sexual Activity  . Alcohol use: Yes    Comment: occassional  . Drug use: No  . Sexual activity: Not on file  Other Topics Concern  . Not on file  Social History Narrative  . Not on file   Social Determinants of Health   Financial Resource Strain:   . Difficulty of Paying Living Expenses:   Food Insecurity:   . Worried About Charity fundraiser in the Last Year:   . Arboriculturist in the Last Year:   Transportation Needs:   . Film/video editor (Medical):   Marland Kitchen Lack of Transportation (Non-Medical):   Physical Activity:   . Days of Exercise per Week:   . Minutes of Exercise per Session:   Stress:   . Feeling of Stress :   Social Connections:   . Frequency of Communication with Friends and Family:   . Frequency of Social Gatherings with Friends  and Family:   . Attends Religious Services:   . Active Member of Clubs or Organizations:   . Attends Archivist Meetings:   Marland Kitchen Marital Status:   Intimate Partner Violence:   . Fear of Current or Ex-Partner:   . Emotionally Abused:   Marland Kitchen Physically Abused:   . Sexually Abused:     Review of Systems: See HPI, otherwise negative ROS  Physical Exam: BP (!) 167/102   Pulse 67   Temp (!) 96.5 F (35.8 C) (Temporal)   Resp 16   Ht 5\' 8"  (1.727 m)   Wt 105.2 kg   SpO2 100%   BMI 35.28 kg/m  General:   Alert,  pleasant and cooperative in NAD Head:  Normocephalic and atraumatic. Neck:  Supple; no masses or thyromegaly. Lungs:  Clear throughout to auscultation, normal respiratory  effort.    Heart:  +S1, +S2, Regular rate and rhythm, No edema. Abdomen:  Soft, nontender and nondistended. Normal bowel sounds, without guarding, and without rebound.   Neurologic:  Alert and  oriented x4;  grossly normal neurologically.  Impression/Plan: Sheila Eaton is here for a sigmoidoscopy  to be performed for  Evaluation of an abnormal CT scan of the abdomen .Risks, benefits, limitations, and alternatives regarding  colonoscopy have been reviewed with the patient.  Questions have been answered.  All parties agreeable.   Jonathon Bellows, MD  09/22/2019, 10:14 AM

## 2019-09-22 NOTE — Transfer of Care (Signed)
Immediate Anesthesia Transfer of Care Note  Patient: Sheila Eaton  Procedure(s) Performed: FLEXIBLE SIGMOIDOSCOPY (N/A )  Patient Location: PACU  Anesthesia Type:General  Level of Consciousness: awake and drowsy  Airway & Oxygen Therapy: Patient Spontanous Breathing  Post-op Assessment: Report given to RN and Post -op Vital signs reviewed and stable  Post vital signs: Reviewed and stable  Last Vitals:  Vitals Value Taken Time  BP    Temp    Pulse    Resp    SpO2      Last Pain:  Vitals:   09/22/19 1037  TempSrc:   PainSc: 0-No pain         Complications: No complications documented.

## 2019-09-22 NOTE — Anesthesia Preprocedure Evaluation (Signed)
Anesthesia Evaluation  Patient identified by MRN, date of birth, ID band Patient awake    Reviewed: Allergy & Precautions, NPO status , Patient's Chart, lab work & pertinent test results  History of Anesthesia Complications Negative for: history of anesthetic complications  Airway Mallampati: II  TM Distance: >3 FB Neck ROM: Full    Dental no notable dental hx. (+) Teeth Intact   Pulmonary neg sleep apnea, COPD, Patient abstained from smoking.Not current smoker, former smoker,    breath sounds clear to auscultation- rhonchi (-) wheezing      Cardiovascular hypertension, Pt. on medications (-) CAD, (-) Past MI, (-) Cardiac Stents and (-) CABG  Rhythm:Regular Rate:Normal - Systolic murmurs and - Diastolic murmurs    Neuro/Psych neg Seizures negative neurological ROS  negative psych ROS   GI/Hepatic negative GI ROS, Neg liver ROS,   Endo/Other  diabetes  Renal/GU negative Renal ROS     Musculoskeletal negative musculoskeletal ROS (+)   Abdominal (+) + obese,   Peds  Hematology negative hematology ROS (+)   Anesthesia Other Findings Past Medical History: No date: Collagen vascular disease ([place])     Comment:  lupus No date: Hypertension No date: Lupus ([place])   Reproductive/Obstetrics                             Anesthesia Physical  Anesthesia Plan  ASA: II  Anesthesia Plan: General   Post-op Pain Management:    Induction: Intravenous  PONV Risk Score and Plan: 3 and Propofol infusion  Airway Management Planned: Natural Airway  Additional Equipment: None  Intra-op Plan:   Post-operative Plan:   Informed Consent: I have reviewed the patients History and Physical, chart, labs and discussed the procedure including the risks, benefits and alternatives for the proposed anesthesia with the patient or authorized representative who has indicated his/her understanding and acceptance.      Dental advisory given  Plan Discussed with: CRNA and Anesthesiologist  Anesthesia Plan Comments: (Discussed risks of anesthesia with patient, including possibility of difficulty with spontaneous ventilation under anesthesia necessitating airway intervention, PONV, and rare risks such as cardiac or respiratory or neurological events. Patient understands.)        Anesthesia Quick Evaluation

## 2019-09-22 NOTE — Op Note (Signed)
[facility] Gastroenterology Patient Name: Sheila Eaton Procedure Date: 09/22/2019 10:14 AM MRN: [id number] Account #: [id number] Date of Birth: 1960-11-29 Admit Type: Outpatient Age: 59 Room: [facility] ENDO ROOM 3 Gender: Female Note Status: Finalized Procedure:             Flexible Sigmoidoscopy Indications:           Abnormal CT of the GI tract Providers:             Jonathon Bellows MD, MD Referring MD:          Casilda Carls, MD (Referring MD) Medicines:             Monitored Anesthesia Care Complications:         No immediate complications. Procedure:             Pre-Anesthesia Assessment:                        - Prior to the procedure, a History and Physical was                         performed, and patient medications, allergies and                         sensitivities were reviewed. The patient's tolerance                         of previous anesthesia was reviewed.                        - The risks and benefits of the procedure and the                         sedation options and risks were discussed with the                         patient. All questions were answered and informed                         consent was obtained.                        - ASA Grade Assessment: II - A patient with mild                         systemic disease.                        After obtaining informed consent, the scope was passed                         under direct vision. The Colonoscope was introduced                         through the anus and advanced to the the left                         transverse colon. The flexible sigmoidoscopy was                         accomplished  with ease. The patient tolerated the                         procedure well. The quality of the bowel preparation                         was adequate. Findings:      The perianal and digital rectal examinations were normal.      A 5 mm polyp was found in the sigmoid colon. The polyp  was sessile. The       polyp was removed with a cold snare. Resection and retrieval were       complete.      Multiple small-mouthed diverticula were found in the sigmoid colon.      The exam was otherwise without abnormality. Impression:            - No specimens collected. Recommendation:        - Discharge patient to home (with escort).                        - Advance diet as tolerated.                        - Await pathology results.                        - Return to my office as previously scheduled. Procedure Code(s):     --- Professional ---                        [phone], Sigmoidoscopy, flexible; with removal of                         tumor(s), polyp(s), or other lesion(s) by snare                         technique Diagnosis Code(s):     --- Professional ---                        R93.3, Abnormal findings on diagnostic imaging of                         other parts of digestive tract CPT copyright 2019 American Medical Association. All rights reserved. The codes documented in this report are preliminary and upon coder review may  be revised to meet current compliance requirements. Jonathon Bellows, MD Jonathon Bellows MD, MD 09/22/2019 10:35:17 AM This report has been signed electronically. Number of Addenda: 0 Note Initiated On: 09/22/2019 10:14 AM Total Procedure Duration: 0 hours 8 minutes 7 seconds  Estimated Blood Loss:  Estimated blood loss: none. Estimated blood loss: none.      [facility]

## 2019-09-23 NOTE — Progress Notes (Signed)
Non-identified Voicemail.

## 2019-09-26 ENCOUNTER — Encounter: Payer: Self-pay | Admitting: Gastroenterology

## 2019-09-26 LAB — SURGICAL PATHOLOGY

## 2019-09-26 NOTE — Progress Notes (Signed)
Sheila Eaton   1. Liver lesion likely benign- suggest repeat MRI liver with EVOmist in 6 months   2. Some enlarged lymphnodes seen in the abdomen : suggest CT abdomen and pelvis with IV contrast in 3 months   C/c : Casilda Carls, MD   Dr Jonathon Bellows MD,MRCP [facility]) Gastroenterology/Hepatology Pager: [phone]

## 2019-10-04 ENCOUNTER — Telehealth: Payer: Self-pay

## 2019-10-04 ENCOUNTER — Telehealth: Payer: Self-pay | Admitting: Gastroenterology

## 2019-10-04 NOTE — Telephone Encounter (Signed)
Left vm for patient returning her call. Pt notified on vm that no appt is currently made with Dr. Vicente Males, but to cb if she wants to sch.

## 2019-10-04 NOTE — Telephone Encounter (Signed)
-----   Message from Jonathon Bellows, MD sent at 09/26/2019 11:25 AM EDT ----- Sheila Eaton   1. Liver lesion likely benign- suggest repeat MRI liver with EVOmist in 6 months   2. Some enlarged lymphnodes seen in the abdomen : suggest CT abdomen and pelvis with IV contrast in 3 months   C/c : Casilda Carls, MD   Dr Jonathon Bellows MD,MRCP [facility]) Gastroenterology/Hepatology Pager: [phone]

## 2019-10-04 NOTE — Telephone Encounter (Signed)
Called and left a message for call back  

## 2019-10-16 NOTE — Telephone Encounter (Signed)
Called pt to inform of results and Dr. Anna's recommendations.  Unable to contact, LVM to return call 

## 2019-10-26 ENCOUNTER — Other Ambulatory Visit: Payer: Self-pay

## 2019-10-26 MED ORDER — DICYCLOMINE HCL 10 MG PO CAPS: 10.0000 mg | ORAL_CAPSULE | Freq: Three times a day (TID) | ORAL | 2 refills | Status: DC

## 2019-10-26 NOTE — Telephone Encounter (Signed)
Pt has been notified of results and Dr. Georgeann Oppenheim recommendations. Pt states she's still experiencing some abdominal pain that comes and goes and has requested a refill on her dicyclomine. Refill has been sent.

## 2019-11-23 ENCOUNTER — Telehealth (INDEPENDENT_AMBULATORY_CARE_PROVIDER_SITE_OTHER): Payer: BC Managed Care – PPO | Admitting: Gastroenterology

## 2019-11-23 DIAGNOSIS — R935 Abnormal findings on diagnostic imaging of other abdominal regions, including retroperitoneum: Secondary | ICD-10-CM | POA: Diagnosis not present

## 2019-11-23 DIAGNOSIS — R1013 Epigastric pain: Secondary | ICD-10-CM | POA: Diagnosis not present

## 2019-11-23 NOTE — Progress Notes (Signed)
Sheila Eaton , MD [address]  [place]  [place],  [address]  Main: [phone]  Fax: [phone]   Primary Care Physician: Casilda Carls, MD  Virtual Visit via Telephone Note  I connected with patient on 11/23/19 at  1:45 PM EDT by telephone and verified that I am speaking with the correct person using two identifiers.   I discussed the limitations, risks, security and privacy concerns of performing an evaluation and management service by telephone and the availability of in person appointments. I also discussed with the patient that there may be a patient responsible charge related to this service. The patient expressed understanding and agreed to proceed.  Location of Patient: Home Location of Provider: Home Persons involved: Patient and provider only   History of Present Illness: Follow-up for abnormal imaging  HPI: Sheila Eaton is a 59 y.o. female    Summary of history :  Initially referred and seen in May 2021 for diverticulosis of the colon.  She had a colonoscopy in 2020 with 3 hyperplastic polyps were seen and resected in the rectosigmoid area.She presented to the ER on 08/03/2019 with abdominal pain.  Central.  Ongoing for several years.Underwent a CT scan of the abdomen pelvis.  Some mild segmental thickening of the sigmoid colon in the region of numerous colonic diverticula but without inflammation.  Abnormal hyperattenuation blood vessels.  In the region of the liver. to obtain his liver MRI protocol.  Small hiatal hernia.  Hemoglobin 14.6 g.  CMP was normal.  Given a course of ciprofloxacin and Flagyl.  Treated with Bentyl for pain along with Vicodin and discharged to follow-up with GI.  She was having no abdominal pain but having diarrhea of the initial visit.  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  Suggest follow-up of MRI abdomen with IV Eovist in 6 months.  Mild lower retroperitoneal lymphadenopathy noted repeat CT scan with oral and IV contrast in 3 months recommended.  Diffuse hepatic steatosis noted small hiatal hernia noted diffuse colonic diverticulosis noted.  09/22/2019: Flexible sigmoidoscopy 5 mm polyp resected in the sigmoid colon.  Otherwise sigmoidoscopy appeared normal.  Polyp was hyperplastic in nature.  Since her last visit she has been doing well.  The dicyclomine does help with her abdominal discomfort but not completely.  Current Outpatient Medications  Medication Sig Dispense Refill  . dicyclomine (BENTYL) 10 MG capsule Take 1 capsule (10 mg total) by mouth 4 (four) times daily -  before meals and at bedtime. 120 capsule 2  . fluticasone (FLONASE) 50 MCG/ACT nasal spray SMARTSIG:2 Spray(s) Both Nares Daily PRN    . hydrochlorothiazide (HYDRODIURIL) 25 MG tablet Take by mouth.    . hydroxychloroquine (PLAQUENIL) 200 MG tablet 1 tab twice a day for lupus , 180 tabs for 90 day    . metFORMIN (GLUCOPHAGE) 500 MG tablet Take by mouth daily.    . metoprolol tartrate (LOPRESSOR) 25 MG tablet Take 25 mg by mouth 2 (two) times daily.    . valsartan (DIOVAN) 320 MG tablet Take 320 mg by mouth daily. (Patient not taking: Reported on 09/22/2019)     No current facility-administered medications for this visit.    Allergies as of 11/23/2019 - Review Complete 09/22/2019  Allergen Reaction Noted  . Other  11/03/2016    Review of Systems:    All systems reviewed and  negative except where noted in HPI.   Observations/Objective:  Labs: CMP     Component Value Date/Time   NA 137 08/03/2019 1308   K 3.9 08/03/2019 1308   CL 104 08/03/2019 1308   CO2 25 08/03/2019 1308   GLUCOSE 95 08/03/2019 1308   BUN 12 08/03/2019 1308   CREATININE 0.55 08/03/2019 1308   CALCIUM 8.9 08/03/2019 1308   PROT 8.6 (H) 08/03/2019 1308   ALBUMIN 4.6 08/03/2019 1308   AST 22 08/03/2019  1308   ALT 24 08/03/2019 1308   ALKPHOS 67 08/03/2019 1308   BILITOT 0.8 08/03/2019 1308   GFRNONAA >60 08/03/2019 1308   GFRAA >60 08/03/2019 1308   Lab Results  Component Value Date   WBC 6.6 08/03/2019   HGB 14.6 08/03/2019   HCT 43.3 08/03/2019   MCV 84.9 08/03/2019   PLT 310 08/03/2019    Imaging Studies: No results found.  Assessment and Plan:   Sheila Eaton is a 59 y.o. y/o female here to follow-up to her initial visit when she presented with a prior history of abdominal pain.  CT scan of the abdomen demonstrated mild abnormalities in the region of the sigmoid colon.  No acute inflammation.  Treated with antibiotics for diverticulitis.  CT scan suggested direct visualization.  Normal colonoscopy less than a year back.   She also haso prominent blood vessels seen in the liver with some hyperattenuation.  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  Mild lower retroperitoneal lymphadenopathy was also noted stable since May 2021 suggested repeat CT abdomen pelvis with oral and IV contrast in 3 months.  Some abdominal pain persists at this point of time.  Not significant.  Dicyclomine helped but not completely.  Plan 1.    CT abdomen and pelvis with IV contrast and oral contrast to follow-up and lower retroperitoneal lymphadenopathy seen on recent MRI. 2.  MRI of the liver with intravenous Eovist in December 2021.ie 6 months follow up  3.  Increase the dose of dicyclomine to 20 mg 3 times daily as needed as needed for pain  Follow-up in 5 months telephone visit   I discussed the assessment and treatment plan with the patient. The patient was provided an opportunity to ask questions and all were answered. The patient agreed with the plan and demonstrated an understanding of the instructions.   The patient was advised to call back or seek an in-person evaluation if the symptoms worsen or if the condition fails to improve as  anticipated.  I provided 15  minutes of non-face-to-face time during this encounter.  Dr Sheila Bellows MD,MRCP [facility]) Gastroenterology/Hepatology Pager: [phone]   Speech recognition software was used to dictate this note.

## 2019-12-26 ENCOUNTER — Other Ambulatory Visit: Payer: Self-pay

## 2019-12-26 ENCOUNTER — Telehealth: Payer: Self-pay

## 2019-12-26 DIAGNOSIS — R935 Abnormal findings on diagnostic imaging of other abdominal regions, including retroperitoneum: Secondary | ICD-10-CM

## 2019-12-26 NOTE — Telephone Encounter (Signed)
Spoke with pt and was able to schedule the CT scan. Pt is aware of appointment information.

## 2019-12-26 NOTE — Telephone Encounter (Signed)
-----   Message from Rushie Chestnut, [place] sent at 10/26/2019  2:52 PM EDT ----- Regarding: Pt  is due for CT scan 1. Liver lesion likely benign- suggest repeat MRI liver with EVOmist in 6 months   2. Some enlarged lymphnodes seen in the abdomen : suggest CT abdomen and pelvis with IV contrast in 3 months

## 2019-12-26 NOTE — Telephone Encounter (Signed)
Called pt to inquire if she's ready to schedule CT scan.  Unable to contact, LVM to return call

## 2020-01-09 ENCOUNTER — Other Ambulatory Visit: Payer: Self-pay

## 2020-01-09 ENCOUNTER — Ambulatory Visit
Admission: RE | Admit: 2020-01-09 | Discharge: 2020-01-09 | Disposition: A | Discharge status: Home / Self Care | Payer: BC Managed Care – PPO | Source: Ambulatory Visit | Attending: Gastroenterology | Admitting: Gastroenterology

## 2020-01-09 DIAGNOSIS — R935 Abnormal findings on diagnostic imaging of other abdominal regions, including retroperitoneum: Secondary | ICD-10-CM | POA: Diagnosis present

## 2020-01-09 LAB — POCT I-STAT CREATININE: Creatinine, Ser: 0.7 mg/dL (ref 0.44–1.00)

## 2020-01-09 MED ORDER — IOHEXOL 300 MG/ML  SOLN
100.0000 mL | Freq: Once | INTRAMUSCULAR | Status: AC | PRN
  Administered 2020-01-09: 100 mL via INTRAVENOUS

## 2020-01-22 ENCOUNTER — Encounter: Payer: Self-pay | Admitting: Gastroenterology

## 2020-01-24 ENCOUNTER — Telehealth: Payer: Self-pay

## 2020-01-24 DIAGNOSIS — R109 Unspecified abdominal pain: Secondary | ICD-10-CM

## 2020-01-24 DIAGNOSIS — R935 Abnormal findings on diagnostic imaging of other abdominal regions, including retroperitoneum: Secondary | ICD-10-CM

## 2020-01-24 NOTE — Telephone Encounter (Signed)
-----   Message from Jonathon Bellows, MD sent at 01/22/2020  3:19 PM EDT ----- No new changes seen.  Proceed with MRI as mentioned in my last office note for December 2021.  Follow-up after that

## 2020-01-24 NOTE — Telephone Encounter (Signed)
Called pt to inform of results and Dr. Anna's recommendations.  Unable to contact, LVM to return call 

## 2020-03-26 NOTE — Telephone Encounter (Signed)
Patient verbalized understanding. She states she can do MRI any late afternoon or Wednesday mornings. Made appointment for patient on 04/03/2020 [facility] for a 10:30am scan. Nothing to eat or drink 4 hours prior. Made follow up appointment for 04/29/2020

## 2020-03-26 NOTE — Addendum Note (Signed)
Addended by: Radene Knee L on: 03/26/2020 11:36 AM   Modules accepted: Orders

## 2020-03-27 ENCOUNTER — Other Ambulatory Visit: Payer: Self-pay

## 2020-04-03 ENCOUNTER — Ambulatory Visit
Admission: RE | Admit: 2020-04-03 | Discharge: 2020-04-03 | Disposition: A | Discharge status: Home / Self Care | Payer: BC Managed Care – PPO | Source: Ambulatory Visit | Attending: Gastroenterology | Admitting: Gastroenterology

## 2020-04-03 DIAGNOSIS — R109 Unspecified abdominal pain: Secondary | ICD-10-CM | POA: Insufficient documentation

## 2020-04-03 DIAGNOSIS — R935 Abnormal findings on diagnostic imaging of other abdominal regions, including retroperitoneum: Secondary | ICD-10-CM | POA: Diagnosis not present

## 2020-04-03 MED ORDER — GADOBUTROL 1 MMOL/ML IV SOLN
10.0000 mL | Freq: Once | INTRAVENOUS | Status: AC | PRN
  Administered 2020-04-03: 10 mL via INTRAVENOUS

## 2020-04-09 ENCOUNTER — Telehealth: Payer: Self-pay

## 2020-04-09 NOTE — Telephone Encounter (Signed)
-----   Message from Jonathon Bellows, MD sent at 04/08/2020 11:09 AM EST ----- Sheila Eaton  Inform lesion on the liver and lymph nodes all appear stable in comparison to last CT scan.  No new concerns.  Dr Jonathon Bellows MD,MRCP [facility]) Gastroenterology/Hepatology Pager: [phone]

## 2020-04-09 NOTE — Telephone Encounter (Signed)
Called and left a message for call back  

## 2020-04-29 ENCOUNTER — Encounter: Payer: Self-pay | Admitting: Gastroenterology

## 2020-04-29 ENCOUNTER — Ambulatory Visit (INDEPENDENT_AMBULATORY_CARE_PROVIDER_SITE_OTHER): Payer: BC Managed Care – PPO | Admitting: Gastroenterology

## 2020-04-29 ENCOUNTER — Other Ambulatory Visit: Payer: Self-pay

## 2020-04-29 VITALS — BP 163/63 | HR 63 | Ht 67.0 in | Wt 233.8 lb

## 2020-04-29 DIAGNOSIS — R109 Unspecified abdominal pain: Secondary | ICD-10-CM | POA: Diagnosis not present

## 2020-04-29 NOTE — Progress Notes (Unsigned)
Jonathon Bellows MD, MRCP(U.K) [address]  [place]  [place], [place] [address]  Main: [phone]  Fax: [phone]   Primary Care Physician: Casilda Carls, MD  Primary Gastroenterologist:  Dr. Jonathon Bellows   Lower abdominal discomfort  HPI: Sheila Eaton is a 60 y.o. female   Summary of history :  Initially referred and seen in May 2021 for diverticulosis of the colon.  She had a colonoscopy in 2020 with 3 hyperplastic polyps were seen and resected in the rectosigmoid area.She presented to the ER on 08/03/2019 with abdominal pain. Central. Ongoing for several years.Underwent a CT scan of the abdomen pelvis. Some mild segmental thickening of the sigmoid colon in the region of numerous colonic diverticula but without inflammation. Abnormal hyperattenuation blood vessels. In n the region of the liver.Treated with Bentyl for pain along with Vicodin and discharged to follow-up with GI.  She was having no abdominal pain but having diarrhea of the initial visit. 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  Suggest follow-up of MRI abdomen with IV Eovist in 6 months.  Mild lower retroperitoneal lymphadenopathy noted repeat CT scan with oral and IV contrast in 3 months recommended.  Diffuse hepatic steatosis noted small hiatal hernia noted diffuse colonic diverticulosis noted.  09/22/2019: Flexible sigmoidoscopy 5 mm polyp resected in the sigmoid colon.  Otherwise sigmoidoscopy appeared normal.  Polyp was hyperplastic in nature.  Interval history   11/23/2019-04/29/2020  01/10/2020: CT scan of the abdomen pelvis with contrast shows severe colonic diverticulosis with no obstruction.  Ill-defined enhancing lesion in the dome of the liver follow-up with MRI.  Mildly enlarged retroperitoneal lymph nodes similar to CT.   04/03/2020: MRI of the liver with and without contrast  shows colonic diverticulosis lymph nodes no change or enlargement.  Mild hepatic steatosis.  Liver lesion consistent with focal nodular hyperplasia.  Lesion appears stable in size  Since her last visit she continues to have episodic lower abdominal discomfort.  Related to certain foods she consumes.  Has noticed it after drinking wine.  Usually improves after good bowel movement.  Denies any constipation.  Taken Bentyl with no significant improvement.  Not much artificial sugars in the diet at this point of time.  She is on Metformin.  Previously had diarrhea which has stopped.   Current Outpatient Medications  Medication Sig Dispense Refill  . fluticasone (FLONASE) 50 MCG/ACT nasal spray SMARTSIG:2 Spray(s) Both Nares Daily PRN    . hydrochlorothiazide (HYDRODIURIL) 25 MG tablet Take by mouth.    . hydroxychloroquine (PLAQUENIL) 200 MG tablet 1 tab twice a day for lupus , 180 tabs for 90 day    . metFORMIN (GLUCOPHAGE) 500 MG tablet Take by mouth daily.    . metoprolol tartrate (LOPRESSOR) 25 MG tablet Take 25 mg by mouth 2 (two) times daily.    . valsartan (DIOVAN) 320 MG tablet Take 320 mg by mouth daily.    Marland Kitchen dicyclomine (BENTYL) 10 MG capsule Take 1 capsule (10 mg total) by mouth 4 (four) times daily -  before meals and at bedtime. 120 capsule 2   No current facility-administered medications for this visit.    Allergies as of 04/29/2020 - Review Complete 04/29/2020  Allergen Reaction Noted  . Other  11/03/2016    ROS:  General: Negative for anorexia, weight loss, fever, chills, fatigue, weakness. ENT: Negative for hoarseness, difficulty swallowing , nasal congestion. CV: Negative for  chest pain, angina, palpitations, dyspnea on exertion, peripheral edema.  Respiratory: Negative for dyspnea at rest, dyspnea on exertion, cough, sputum, wheezing.  GI: See history of present illness. GU:  Negative for dysuria, hematuria, urinary incontinence, urinary frequency, nocturnal urination.   Endo: Negative for unusual weight change.    Physical Examination:   BP (!) 163/63 (BP Location: Left Arm, Patient Position: Sitting, Cuff Size: Large)   Pulse 63   Ht 5\' 7"  (1.702 m)   Wt 233 lb 12.8 oz (106.1 kg)   BMI 36.62 kg/m   General: Well-nourished, well-developed in no acute distress.  Eyes: No icterus. Conjunctivae pink. Mouth: Oropharyngeal mucosa moist and pink , no lesions erythema or exudate. Abdomen: Bowel sounds are normal, nontender, nondistended, no hepatosplenomegaly or masses, no abdominal bruits or hernia , no rebound or guarding.   Extremities: No lower extremity edema. No clubbing or deformities. Neuro: Alert and oriented x 3.  Grossly intact. Skin: Warm and dry, no jaundice.   Psych: Alert and cooperative, normal mood and affect.   Imaging Studies: MR LIVER W WO CONTRAST  Result Date: 04/03/2020 CLINICAL DATA:  Follow-up indeterminate liver lesion. No history of malignancy. EXAM: MRI ABDOMEN WITHOUT AND WITH CONTRAST TECHNIQUE: Multiplanar multisequence MR imaging of the abdomen was performed both before and after the administration of intravenous contrast. CONTRAST:  32mL GADAVIST GADOBUTROL 1 MMOL/ML IV SOLN COMPARISON:  Abdominal CT 08/03/2019 and 01/09/2020. Abdominal MRI 09/12/2019. FINDINGS: Lower chest:  The visualized lower chest appears unremarkable. Hepatobiliary: Mild diffuse hepatic steatosis again noted. Focus of hyperenhancement in the dome of the right hepatic lobe (segment 8) is again noted, unchanged from the previous studies. This measures 3.0 x 1.4 cm on image 24/12 and becomes less distinct on portal and delayed phase images. Unfortunately, today's study was not performed with Eovist. This lesion remains essentially occult prior to contrast administration, although there is minimal T2 hyperintensity. No restricted diffusion. No new or enlarging hepatic lesions. No evidence of gallstones, gallbladder wall thickening or biliary dilatation.  Pancreas: Unremarkable. No pancreatic ductal dilatation or surrounding inflammatory changes. Spleen: Normal in size without focal abnormality. Adrenals/Urinary Tract: Both adrenal glands appear normal. Tiny renal cysts. No hydronephrosis. Stomach/Bowel: The stomach appears unremarkable for its degree of distension. No evidence of bowel wall thickening, distention or surrounding inflammatory change.Diffuse colonic diverticulosis again noted. Vascular/Lymphatic: Previously demonstrated prominent lower aortocaval nodes are only imaged on the coronal sequences for today's study, but appear unchanged. Atherosclerosis without evidence of aneurysm or large vessel occlusion. Other: No evidence of abdominal wall hernia or ascites. Musculoskeletal: No acute or significant osseous findings. IMPRESSION: 1. Stable size and appearance of focal hypervascular lesion in the dome of the right hepatic lobe (segment 8), compared with previous studies dating back to 8 months ago. Unfortunately, Eovist contrast was not administered for today's study. 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 2. No new or enlarging hepatic lesions. 3. Mild hepatic steatosis. 4. Previously demonstrated prominent lower aortocaval nodes are only imaged on the coronal sequences for today's study, but appear unchanged. 5. Diffuse colonic diverticulosis. Electronically Signed   By: Richardean Sale M.D.   On: 04/03/2020 11:53    Assessment and Plan:   Tekoa Marohl Simons-Stafford is a 60 y.o. y/o femalehere to follow-up to her initial visit when she presented with a prior history of abdominal pain.  Prior history of abdominal lymphadenopathy which has been stable.  Possible focal nodular hypeplasia of the  liver.  Main symptoms at this point of time is bloating with lower abdominal discomfort.  Very likely related to bacterial overgrowth  Plan 1.  Would suggest MRI of the abdomen  with and without Eovist in 12 months 2.    Suggest low FODMAP diet, trial of IBgard.  If no better can consider Xifaxan at a later point.   Dr Jonathon Bellows  MD,MRCP [facility]) Follow up in 10 months telephone visit

## 2020-04-29 NOTE — Patient Instructions (Signed)
Low-FODMAP Eating Plan  FODMAP stands for fermentable oligosaccharides, disaccharides, monosaccharides, and polyols. These are sugars that are hard for some people to digest. A low-FODMAP eating plan may help some people who have irritable bowel syndrome (IBS) and certain other bowel (intestinal) diseases to manage their symptoms. This meal plan can be complicated to follow. Work with a diet and nutrition specialist (dietitian) to make a low-FODMAP eating plan that is right for you. A dietitian can help make sure that you get enough nutrition from this diet. What are tips for following this plan? Reading food labels  Check labels for hidden FODMAPs such as: ? High-fructose syrup. ? Honey. ? Agave. ? Natural fruit flavors. ? Onion or garlic powder.  Choose low-FODMAP foods that contain 3-4 grams of fiber per serving.  Check food labels for serving sizes. Eat only one serving at a time to make sure FODMAP levels stay low. Shopping  Shop with a list of foods that are recommended on this diet and make a meal plan. Meal planning  Follow a low-FODMAP eating plan for up to 6 weeks, or as told by your health care provider or dietitian.  To follow the eating plan: 1. Eliminate high-FODMAP foods from your diet completely. Choose only low-FODMAP foods to eat. You will do this for 2-6 weeks. 2. Gradually reintroduce high-FODMAP foods into your diet one at a time. Most people should wait a few days before introducing the next new high-FODMAP food into their meal plan. Your dietitian can recommend how quickly you may reintroduce foods. 3. Keep a daily record of what and how much you eat and drink. Make note of any symptoms that you have after eating. 4. Review your daily record with a dietitian regularly to identify which foods you can eat and which foods you should avoid. General tips  Drink enough fluid each day to keep your urine pale yellow.  Avoid processed foods. These often have added sugar  and may be high in FODMAPs.  Avoid most dairy products, whole grains, and sweeteners.  Work with a dietitian to make sure you get enough fiber in your diet.  Avoid high FODMAP foods at meals to manage symptoms. Recommended foods Fruits Bananas, oranges, tangerines, lemons, limes, blueberries, raspberries, strawberries, grapes, cantaloupe, honeydew melon, kiwi, papaya, passion fruit, and pineapple. Limited amounts of dried cranberries, banana chips, and shredded coconut. Vegetables Eggplant, zucchini, cucumber, peppers, green beans, bean sprouts, lettuce, arugula, kale, Swiss chard, spinach, collard greens, bok choy, summer squash, potato, and tomato. Limited amounts of corn, carrot, and sweet potato. Green parts of scallions. Grains Gluten-free grains, such as rice, oats, buckwheat, quinoa, corn, polenta, and millet. Gluten-free pasta, bread, or cereal. Rice noodles. Corn tortillas. Meats and other proteins Unseasoned beef, pork, poultry, or fish. Eggs. Sheila Eaton. Tofu (firm) and tempeh. Limited amounts of nuts and seeds, such as almonds, walnuts, [place] nuts, pecans, peanuts, nut butters, pumpkin seeds, chia seeds, and sunflower seeds. Dairy Lactose-free milk, yogurt, and kefir. Lactose-free cottage cheese and ice cream. Non-dairy milks, such as almond, coconut, hemp, and rice milk. Non-dairy yogurt. Limited amounts of goat cheese, brie, mozzarella, parmesan, swiss, and other hard cheeses. Fats and oils Butter-free spreads. Vegetable oils, such as olive, canola, and sunflower oil. Seasoning and other foods Artificial sweeteners with names that do not end in "ol," such as aspartame, saccharine, and stevia. Maple syrup, white table sugar, raw sugar, brown sugar, and molasses. Mayonnaise, soy sauce, and tamari. Fresh basil, coriander, parsley, rosemary, and thyme. Beverages Water and  mineral water. Sugar-sweetened soft drinks. Small amounts of orange juice or cranberry juice. Black and green tea.  Most dry wines. Coffee. The items listed above may not be a complete list of foods and beverages you can eat. Contact a dietitian for more information. Foods to avoid Fruits Fresh, dried, and juiced forms of apple, pear, watermelon, peach, plum, cherries, apricots, blackberries, boysenberries, figs, nectarines, and mango. Avocado. Vegetables Chicory root, artichoke, asparagus, cabbage, snow peas, Brussels sprouts, broccoli, sugar snap peas, mushrooms, celery, and cauliflower. Onions, garlic, leeks, and the white part of scallions. Grains Wheat, including kamut, durum, and semolina. Barley and bulgur. Couscous. Wheat-based cereals. Wheat noodles, bread, crackers, and pastries. Meats and other proteins Fried or fatty meat. Sausage. Cashews and pistachios. Soybeans, baked beans, black beans, chickpeas, kidney beans, fava beans, navy beans, lentils, black-eyed peas, and split peas. Dairy Milk, yogurt, ice cream, and soft cheese. Cream and sour cream. Milk-based sauces. Custard. Buttermilk. Soy milk. Seasoning and other foods Any sugar-free gum or candy. Foods that contain artificial sweeteners such as sorbitol, mannitol, isomalt, or xylitol. Foods that contain honey, high-fructose corn syrup, or agave. Bouillon, vegetable stock, beef stock, and chicken stock. Garlic and onion powder. Condiments made with onion, such as hummus, chutney, pickles, relish, salad dressing, and salsa. Tomato paste. Beverages Chicory-based drinks. Coffee substitutes. Chamomile tea. Fennel tea. Sweet or fortified wines such as port or sherry. Diet soft drinks made with isomalt, mannitol, maltitol, sorbitol, or xylitol. Apple, pear, and mango juice. Juices with high-fructose corn syrup. The items listed above may not be a complete list of foods and beverages you should avoid. Contact a dietitian for more information. Summary  FODMAP stands for fermentable oligosaccharides, disaccharides, monosaccharides, and polyols. These  are sugars that are hard for some people to digest.  A low-FODMAP eating plan is a short-term diet that helps to ease symptoms of certain bowel diseases.  The eating plan usually lasts up to 6 weeks. After that, high-FODMAP foods are reintroduced gradually and one at a time. This can help you find out which foods may be causing symptoms.  A low-FODMAP eating plan can be complicated. It is best to work with a dietitian who has experience with this type of plan. This information is not intended to replace advice given to you by your health care provider. Make sure you discuss any questions you have with your health care provider. Document Revised: 07/27/2019 Document Reviewed: 07/27/2019 Elsevier Patient Education  [place].

## 2021-06-26 IMAGING — CT CT ABD-PELV W/ CM
2 of 5 series · 16 of 46 positions shown, 18 images · IV contrast (omnipaque)
Comparison: Abdominal CT dated 08/03/2019 and MRI dated 09/12/2019

CLINICAL DATA: 58-year-old female with history of diverticulosis
and colonic polyp removal.

EXAM:
CT ABDOMEN AND PELVIS WITH CONTRAST
TECHNIQUE: Multidetector CT imaging of the abdomen and pelvis was performed
using the standard protocol following bolus administration of
intravenous contrast.
CONTRAST:  100mL OMNIPAQUE IOHEXOL 300 MG/ML  SOLN

[Series 2: abd pelvis 5.00 · axial · 0.84mm/px · z∈[-1506,-1056]mm · 13 of 100 slices shown, 15 images]
[im 5/100  soft-tissue]
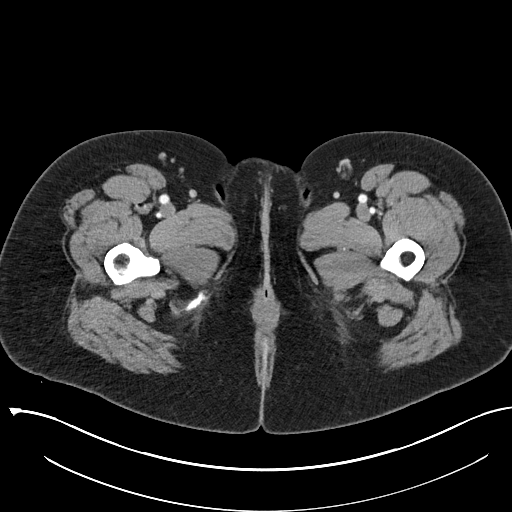
[im 5/100  bone]
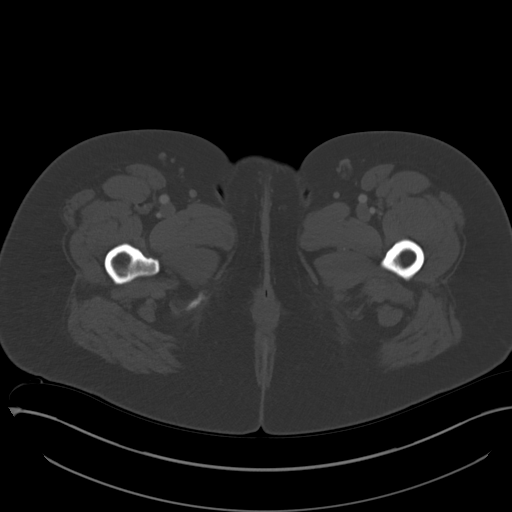
[im 15/100  soft-tissue]
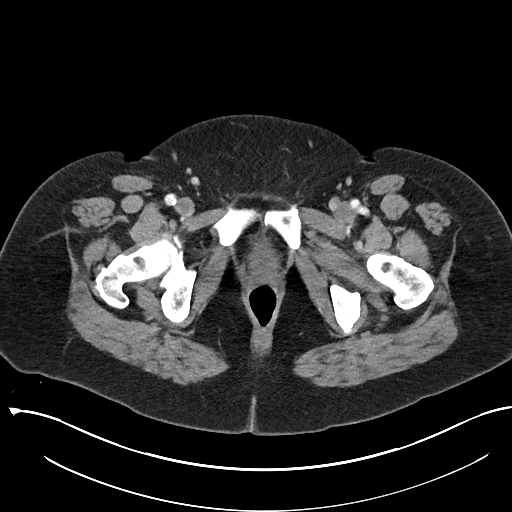
[im 20/100  soft-tissue]
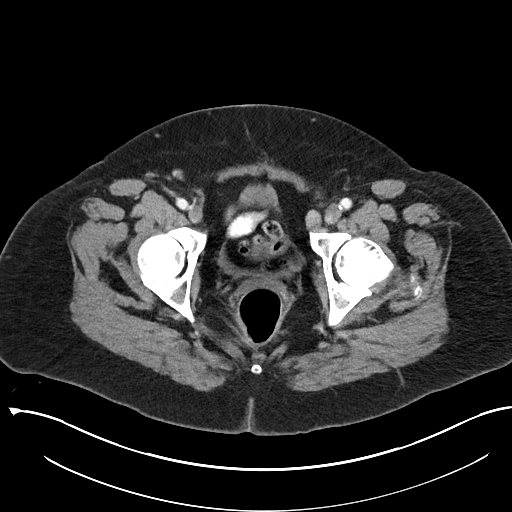
[im 30/100  soft-tissue]
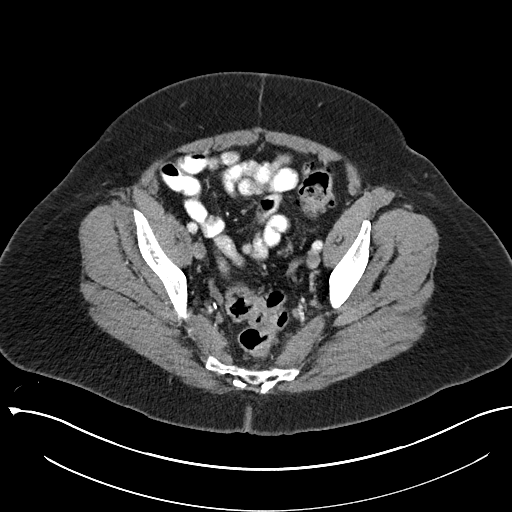
[im 35/100  soft-tissue]
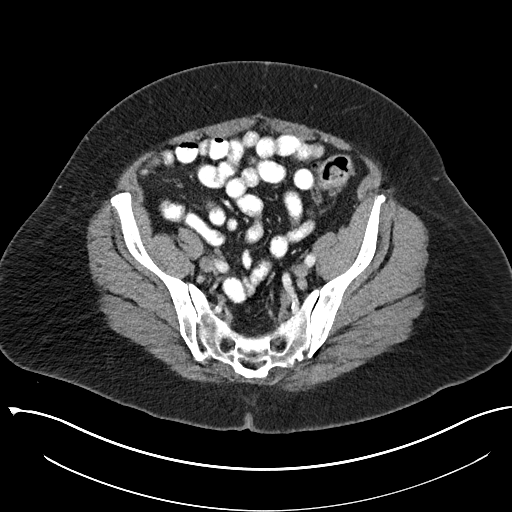
[im 45/100  soft-tissue]
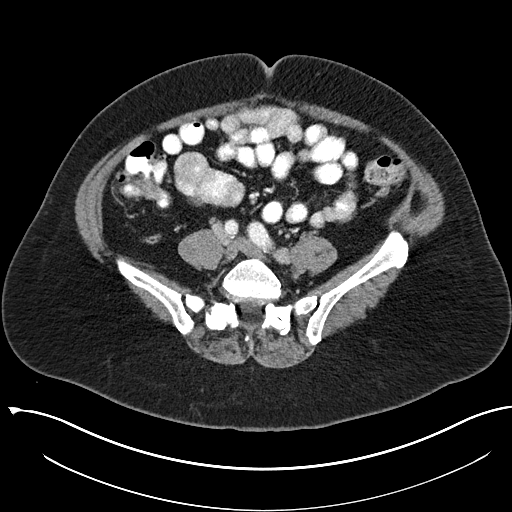
[im 50/100  soft-tissue]
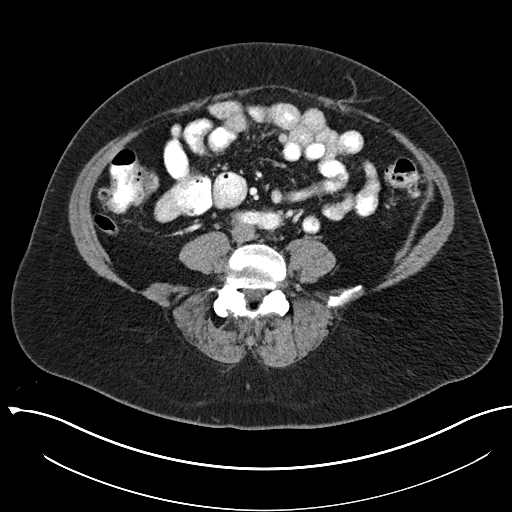
[im 55/100  soft-tissue]
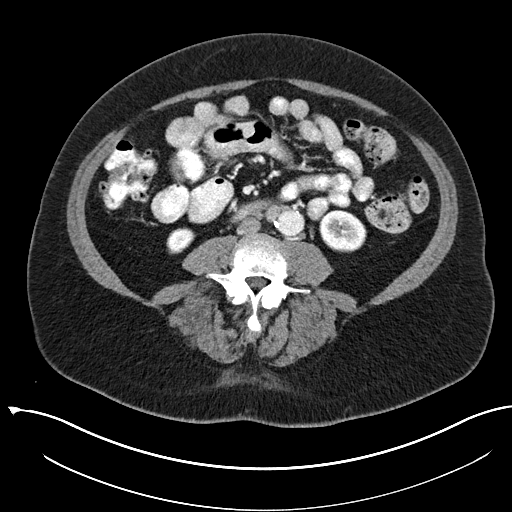
[im 65/100  soft-tissue]
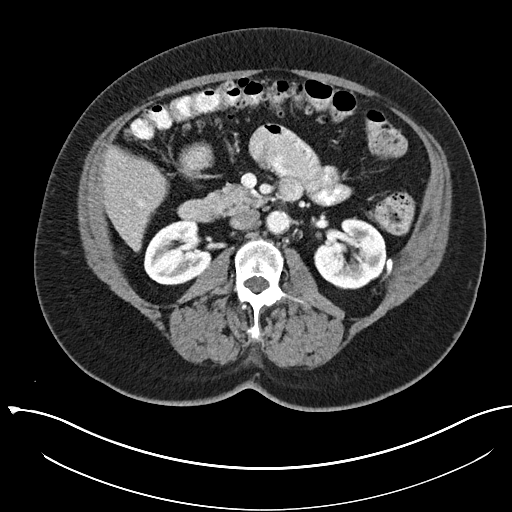
[im 65/100  bone]
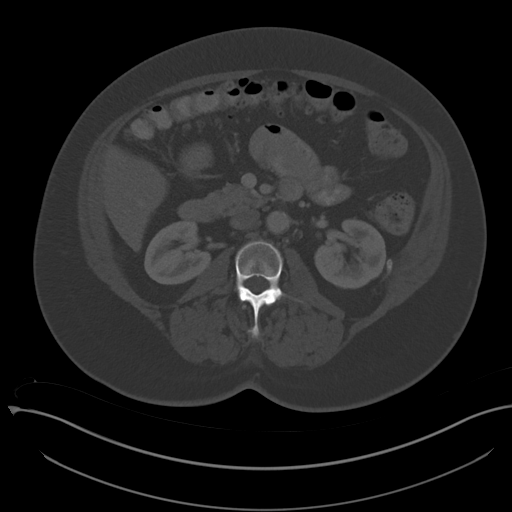
[im 70/100  soft-tissue]
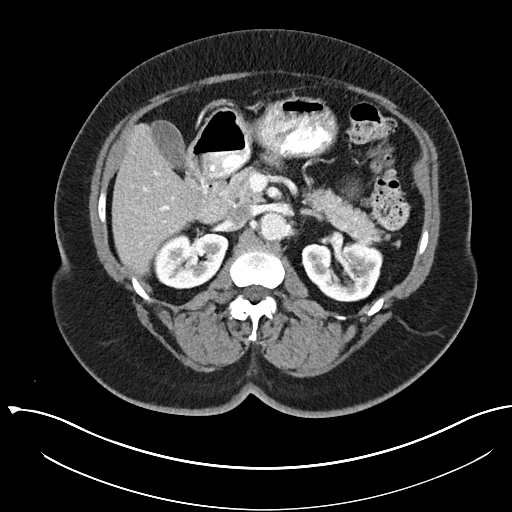
[im 80/100  soft-tissue]
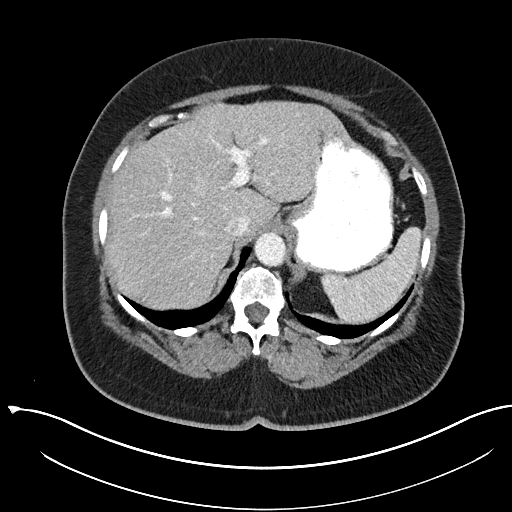
[im 85/100  soft-tissue]
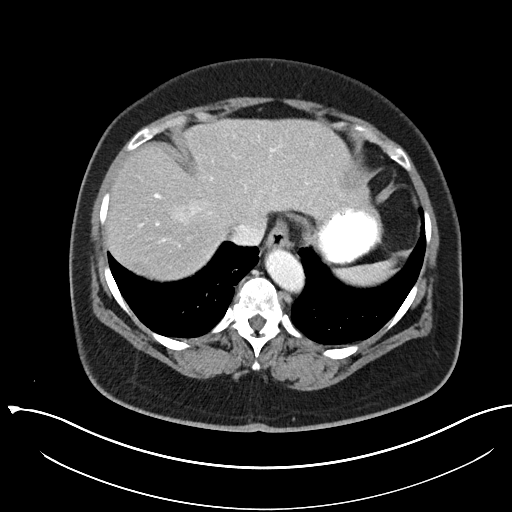
[im 95/100  soft-tissue]
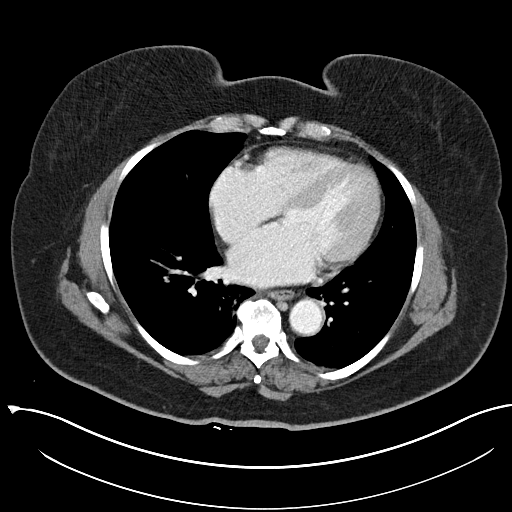

[Series 4: coronals abd pelvis 2.00 cor · coronal · 0.84mm/px · 3 of 173 slices shown]
[im 58/173  soft-tissue]
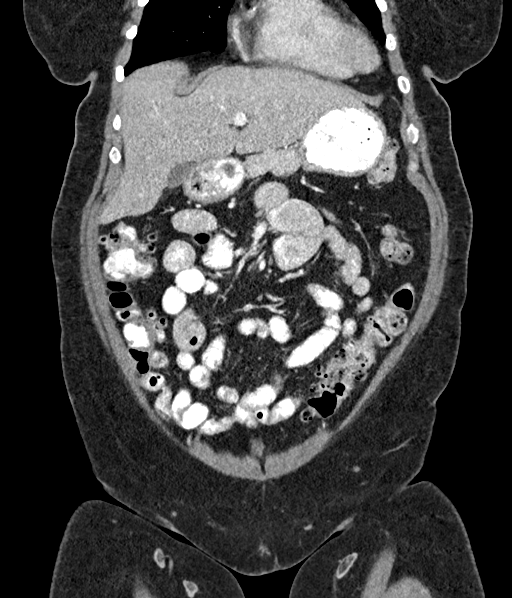
[im 77/173  soft-tissue]
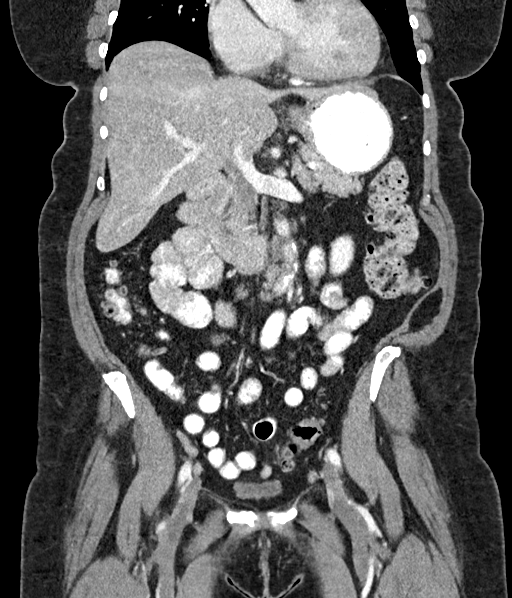
[im 96/173  soft-tissue]
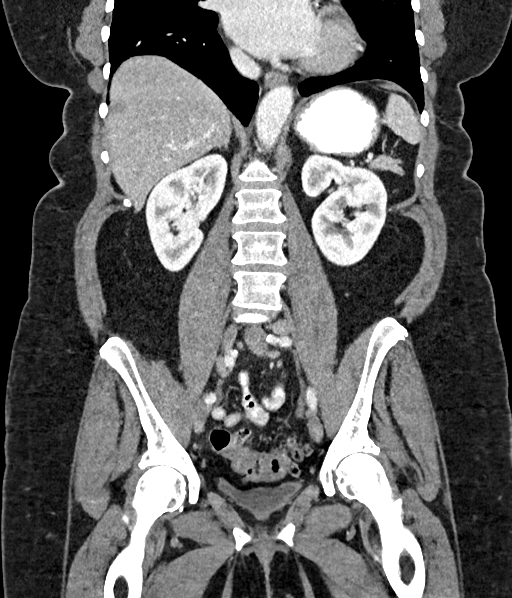

[16 of 46 positions shown; findings below may reference images not displayed]

FINDINGS: Lower chest: Minimal bibasilar linear atelectasis/scarring. The
visualized lung bases are otherwise clear.

No intra-abdominal free air or free fluid.

Hepatobiliary: Probable mild fatty liver. Ill-defined 3.3 x 1.6 cm
enhancing focus in the dome of the liver (segment VIII) as seen on
the prior CT and suggested as FNH on the prior MRI. Follow-up as per
recommendation of the prior MRI. No intrahepatic biliary ductal
dilatation. The gallbladder is unremarkable.

Pancreas: Unremarkable. No pancreatic ductal dilatation or
surrounding inflammatory changes.

Spleen: Normal in size without focal abnormality.

Adrenals/Urinary Tract: The adrenal glands unremarkable. There is no
hydronephrosis on either side. There is symmetric enhancement and
excretion of contrast by both kidneys. Left renal upper pole
cortical irregularity similar to prior CT. The visualized ureters
appear unremarkable. The urinary bladder is collapsed.

Stomach/Bowel: There is severe sigmoid and diffuse colonic
diverticulosis without active inflammatory changes. There is no
bowel obstruction or active inflammation. The appendix is normal.

Vascular/Lymphatic: Mild aortoiliac atherosclerotic disease. The IVC
is unremarkable. No portal venous gas. Top-normal right para-aortic
lymph node similar to prior CT. Mildly enlarged lymph node along the
left iliac chain measure up to 14 mm (56/2) similar to prior CT.

Reproductive: Hysterectomy.

Other: Midline vertical anterior pelvic wall incisional scar.

Musculoskeletal: Lower lumbar facet arthropathy. No acute osseous
pathology.
IMPRESSION: 1. No acute intra-abdominal or pelvic pathology.
2. Severe colonic diverticulosis. No bowel obstruction. Normal
appendix.
3. Ill-defined enhancing lesion in the dome of the liver as seen
previously. Follow-up as per recommendation of the prior MRI.
4. Mildly enlarged retroperitoneal lymph nodes similar to CT of
08/03/2019. Attention on follow-up imaging recommended
5. Aortic Atherosclerosis ([id number]).

## 2022-02-18 ENCOUNTER — Other Ambulatory Visit: Payer: Self-pay

## 2022-02-18 ENCOUNTER — Ambulatory Visit
Admission: EM | Admit: 2022-02-18 | Discharge: 2022-02-18 | Disposition: A | Discharge status: Home / Self Care | Payer: BC Managed Care – PPO

## 2022-02-18 DIAGNOSIS — R6889 Other general symptoms and signs: Secondary | ICD-10-CM

## 2022-02-18 DIAGNOSIS — R051 Acute cough: Secondary | ICD-10-CM

## 2022-02-18 MED ORDER — BENZONATATE 100 MG PO CAPS: ORAL_CAPSULE | ORAL | 0 refills | Status: DC

## 2022-02-18 NOTE — ED Triage Notes (Signed)
Pt. Present to UC w/ c/o a cough and body aches that started 2 days ago. Pt. States she was recently exposed to [place].

## 2022-02-18 NOTE — ED Provider Notes (Signed)
Sheila Eaton    CSN: [id number] Arrival date & time: 02/18/22  0803      History   Chief Complaint Chief Complaint  Patient presents with   Cough   Generalized Body Aches   Fever    HPI Sheila Eaton is a 61 y.o. female.    Cough Associated symptoms: fever   Fever Associated symptoms: cough     Patient presents to urgent care with complaint of cough and bodyaches starting 2 days ago.  Patient endorses recent exposure to COVID on Saturday (4 days ago) at a birthday party.  Other attendees of that party also sick today.  She reports a fever and dizziness today.  Denies nausea, vomiting, diarrhea.  She is self treating with Delsym (because of high blood pressure) and Tylenol.  She reports that the Tylenol is not controlling her body aches.  She reports avoidance of NSAIDs due to history of kidney issues.  Past Medical History:  Diagnosis Date   Collagen vascular disease ([place])    lupus   Diabetes mellitus without complication ([place])    Hypertension    Lupus ([place])     There are no problems to display for this patient.   Past Surgical History:  Procedure Laterality Date   ABDOMINAL HYSTERECTOMY     COLONOSCOPY WITH PROPOFOL N/A 10/21/2018   Procedure: COLONOSCOPY WITH PROPOFOL;  Surgeon: Jonathon Bellows, MD;  Location: [facility] ENDOSCOPY;  Service: Gastroenterology;  Laterality: N/A;   FLEXIBLE SIGMOIDOSCOPY N/A 09/22/2019   Procedure: FLEXIBLE SIGMOIDOSCOPY;  Surgeon: Jonathon Bellows, MD;  Location: [facility] ENDOSCOPY;  Service: Gastroenterology;  Laterality: N/A;   LEG SURGERY     VASCULAR SURGERY      OB History   No obstetric history on file.      Home Medications    Prior to Admission medications   Medication Sig Start Date End Date Taking? Authorizing Provider  dicyclomine (BENTYL) 10 MG capsule Take 1 capsule (10 mg total) by mouth 4 (four) times daily -  before meals and at bedtime. 10/26/19 01/24/20  Jonathon Bellows, MD  fluticasone Asencion Islam) 50  MCG/ACT nasal spray SMARTSIG:2 Spray(s) Both Nares Daily PRN 04/12/19   [provider]  hydrochlorothiazide (HYDRODIURIL) 25 MG tablet Take by mouth. 10/18/18   [provider]  hydroxychloroquine (PLAQUENIL) 200 MG tablet 1 tab twice a day for lupus , 180 tabs for 90 day 05/03/19   [provider]  metFORMIN (GLUCOPHAGE) 500 MG tablet Take by mouth daily.    [provider]  metoprolol tartrate (LOPRESSOR) 25 MG tablet Take 25 mg by mouth 2 (two) times daily. 06/08/19   [provider]  valsartan (DIOVAN) 320 MG tablet Take 320 mg by mouth daily. 06/08/19   [provider]    Family History Family History  Problem Relation Age of Onset   Breast cancer Neg Hx     Social History Social History   Tobacco Use   Smoking status: Former   Smokeless tobacco: Never  Scientific laboratory technician Use: Never used  Substance Use Topics   Alcohol use: Yes    Comment: occassional   Drug use: No     Allergies   Other   Review of Systems Review of Systems  Constitutional:  Positive for fever.  Respiratory:  Positive for cough.      Physical Exam Triage Vital Signs ED Triage Vitals [02/18/22 0815]  Enc Vitals Group     BP (!) 156/100  Pulse Rate 100     Resp 16     Temp 98.9 F (37.2 C)     Temp src      SpO2 95 %     Weight      Height      Head Circumference      Peak Flow      Pain Score      Pain Loc      Pain Edu?      Excl. in [place]?    No data found.  Updated Vital Signs BP (!) 156/100   Pulse 100   Temp 98.9 F (37.2 C)   Resp 16   SpO2 95%   Visual Acuity Right Eye Distance:   Left Eye Distance:   Bilateral Distance:    Right Eye Near:   Left Eye Near:    Bilateral Near:     Physical Exam Vitals reviewed.  Constitutional:      Appearance: She is ill-appearing. She is not toxic-appearing.  HENT:     Head: Normocephalic.     Mouth/Throat:     Mouth: Mucous membranes are moist.     Pharynx: Posterior  oropharyngeal erythema present. No oropharyngeal exudate.  Eyes:     Conjunctiva/sclera: Conjunctivae normal.     Pupils: Pupils are equal, round, and reactive to light.  Cardiovascular:     Rate and Rhythm: Regular rhythm. Tachycardia present.     Pulses: Normal pulses.     Heart sounds: Normal heart sounds.  Pulmonary:     Effort: Pulmonary effort is normal.     Breath sounds: Normal breath sounds. No wheezing or rhonchi.  Skin:    General: Skin is warm and dry.  Neurological:     General: No focal deficit present.     Mental Status: She is alert and oriented to person, place, and time.  Psychiatric:        Mood and Affect: Mood normal.        Behavior: Behavior normal.      UC Treatments / Results  Labs (all labs ordered are listed, but only abnormal results are displayed) Labs Reviewed - No data to display  EKG   Radiology No results found.  Procedures Procedures (including critical care time)  Medications Ordered in UC Medications - No data to display  Initial Impression / Assessment and Plan / UC Course  I have reviewed the triage vital signs and the nursing notes.  Pertinent labs & imaging results that were available during my care of the patient were reviewed by me and considered in my medical decision making (see chart for details).   Patient is afebrile here with recent antipyretics (tylenol). Satting reasonably well (95%) on room air. Overall is ill appearing though not toxic, well hydrated, without respiratory distress. Pulmonary exam is unremarkable.  Lungs CTAB without wheezes or rhonchi.  Mild pharyngeal erythema without peritonsillar exudates.  Presumed viral flu-like illness.  Results of respiratory swab is pending.  Will prescribe benzonatate for cough and advised patient to continue to use other OTC medications including Tylenol to control her symptoms.  Recommending bedrest, push hydration, warned to watch for dyspnea or shortness of breath proceed  to ED if they occur.  Final Clinical Impressions(s) / UC Diagnoses   Final diagnoses:  None   Discharge Instructions   None    ED Prescriptions   None    PDMP not reviewed this encounter.   Rose Phi, [place] 02/18/22 [phone]

## 2022-02-18 NOTE — Discharge Instructions (Signed)
Results of your respiratory testing will be posted to your MyChart account in about 24 hours.  If there are any positive results, you will receive a call regarding available treatments.  If you do not receive a call, you can always call the clinic to talk about a positive result.  I have prescribed Tessalon Perles (benzonatate) to help with your cough.  Please watch for concerning signs or symptoms that would require treatment in the emergency room.  This would include shortness of breath, difficulty breathing or dehydration.  In addition to the prescribed cough medicine, recommend bedrest, plenty of fluids, and isolation from others while your symptoms persist.  Follow up here or with your primary care provider if your symptoms are worsening or not improving.

## 2022-02-20 ENCOUNTER — Telehealth: Payer: Self-pay

## 2022-02-20 ENCOUNTER — Ambulatory Visit
Admission: EM | Admit: 2022-02-20 | Discharge: 2022-02-20 | Disposition: A | Discharge status: Home / Self Care | Payer: BC Managed Care – PPO | Attending: Urgent Care | Admitting: Urgent Care

## 2022-02-20 DIAGNOSIS — R52 Pain, unspecified: Secondary | ICD-10-CM | POA: Insufficient documentation

## 2022-02-20 DIAGNOSIS — U071 COVID-19: Secondary | ICD-10-CM | POA: Diagnosis not present

## 2022-02-20 LAB — RESP PANEL BY RT-PCR (FLU A&B, COVID) ARPGX2
Influenza A by PCR: NEGATIVE
Influenza B by PCR: NEGATIVE
SARS Coronavirus 2 by RT PCR: POSITIVE — AB

## 2022-02-20 NOTE — Telephone Encounter (Signed)
Pt Resp Panel unable to be run. Notified pt. Pt will return to [facility] for nurse visit to recollect swab.

## 2022-02-20 NOTE — ED Triage Notes (Signed)
Patient to Urgent Care for reswab.   Order for resp panel by RT-PCR  (RSV, Flu A&B, covid) placed under Provider Annie Main Immordino NP.

## 2022-02-21 ENCOUNTER — Telehealth: Payer: Self-pay

## 2022-02-21 NOTE — Telephone Encounter (Signed)
Patient notified of positive covid results. Treatment discussed with NP Barkley Boards. Patient verbalizes understanding.

## 2022-02-27 LAB — SPECIMEN STATUS REPORT

## 2022-02-27 LAB — COVID-19, FLU A+B AND RSV
Influenza A, NAA: NOT DETECTED
Influenza B, NAA: NOT DETECTED
RSV, NAA: NOT DETECTED
SARS-CoV-2, NAA: DETECTED — AB

## 2022-04-24 ENCOUNTER — Ambulatory Visit
Admission: EM | Admit: 2022-04-24 | Discharge: 2022-04-24 | Disposition: A | Discharge status: Home / Self Care | Payer: BC Managed Care – PPO

## 2022-04-24 ENCOUNTER — Other Ambulatory Visit: Payer: Self-pay

## 2022-04-24 ENCOUNTER — Emergency Department
Admission: EM | Admit: 2022-04-24 | Discharge: 2022-04-24 | Disposition: A | Discharge status: Home / Self Care | Payer: BC Managed Care – PPO | Attending: Emergency Medicine | Admitting: Emergency Medicine

## 2022-04-24 DIAGNOSIS — M79601 Pain in right arm: Secondary | ICD-10-CM | POA: Diagnosis present

## 2022-04-24 DIAGNOSIS — I10 Essential (primary) hypertension: Secondary | ICD-10-CM

## 2022-04-24 DIAGNOSIS — E119 Type 2 diabetes mellitus without complications: Secondary | ICD-10-CM | POA: Insufficient documentation

## 2022-04-24 DIAGNOSIS — M5412 Radiculopathy, cervical region: Secondary | ICD-10-CM | POA: Diagnosis not present

## 2022-04-24 LAB — CBC
HCT: 42.2 % (ref 36.0–46.0)
Hemoglobin: 13.8 g/dL (ref 12.0–15.0)
MCH: 28.2 pg (ref 26.0–34.0)
MCHC: 32.7 g/dL (ref 30.0–36.0)
MCV: 86.3 fL (ref 80.0–100.0)
Platelets: 254 10*3/uL (ref 150–400)
RBC: 4.89 MIL/uL (ref 3.87–5.11)
RDW: 13.6 % (ref 11.5–15.5)
WBC: 7.8 10*3/uL (ref 4.0–10.5)
nRBC: 0 % (ref 0.0–0.2)

## 2022-04-24 LAB — COMPREHENSIVE METABOLIC PANEL
ALT: 25 U/L (ref 0–44)
AST: 30 U/L (ref 15–41)
Albumin: 4.4 g/dL (ref 3.5–5.0)
Alkaline Phosphatase: 54 U/L (ref 38–126)
Anion gap: 7 (ref 5–15)
BUN: 16 mg/dL (ref 8–23)
CO2: 26 mmol/L (ref 22–32)
Calcium: 8.9 mg/dL (ref 8.9–10.3)
Chloride: 107 mmol/L (ref 98–111)
Creatinine, Ser: 0.64 mg/dL (ref 0.44–1.00)
GFR, Estimated: >60 mL/min (ref >60)
Glucose, Bld: 105 mg/dL — ABNORMAL HIGH (ref 70–99)
Potassium: 3.8 mmol/L (ref 3.5–5.1)
Sodium: 140 mmol/L (ref 135–145)
Total Bilirubin: 0.9 mg/dL (ref 0.3–1.2)
Total Protein: 8.1 g/dL (ref 6.5–8.1)

## 2022-04-24 LAB — TROPONIN I (HIGH SENSITIVITY): Troponin I (High Sensitivity): 8 ng/L (ref <18)

## 2022-04-24 MED ORDER — DICLOFENAC SODIUM 1 % EX GEL: 2.0000 g | Freq: Four times a day (QID) | CUTANEOUS | 0 refills | Status: DC

## 2022-04-24 MED ORDER — LIDOCAINE 5 % EX PTCH
1.0000 | MEDICATED_PATCH | CUTANEOUS | Status: DC
  Administered 2022-04-24: 1 via TRANSDERMAL
  Filled 2022-04-24: qty 1

## 2022-04-24 MED ORDER — LIDOCAINE 5 % EX PTCH: 1.0000 | MEDICATED_PATCH | Freq: Two times a day (BID) | CUTANEOUS | 0 refills | Status: DC

## 2022-04-24 MED ORDER — HYDROCHLOROTHIAZIDE 12.5 MG PO CAPS: 12.5000 mg | ORAL_CAPSULE | Freq: Every day | ORAL | 0 refills | Status: DC

## 2022-04-24 NOTE — ED Triage Notes (Signed)
Pt presents to UC for HTN x 1 week.  Reports SBP as high as 201 at home. Also reports R occipital pain radiating to shoulder, weakness and HA during this time. Denies CP, SOB.  Pt reports only meds being Metoprolol, Amlodipine, Valsartan.   EKG performed at bedside with report given to A White NP.  Provider to bedside.   Pt refuses EMS transport to ED.

## 2022-04-24 NOTE — ED Provider Notes (Signed)
[facility] Provider Note    Event Date/Time   First MD Initiated Contact with Patient 04/24/22 [phone]     (approximate)   History   Chief Complaint: Arm Pain and Hypertension   HPI  Sheila Eaton is a 62 y.o. female with a past history of hypertension diabetes and lupus who comes to the ED for evaluation of high blood pressure and right arm pain.  Patient reports blood pressure has been an ongoing issue.  She takes amlodipine 10, Diovan 320, metoprolol 25 twice daily, but over the last week has noticed blood pressures as high as 200/100.  She tried going to urgent care this morning for evaluation but was sent to the ED due to right arm pain.  Patient denies any chest pain shortness of breath or exertional symptoms.  She reports the right arm pain started a week ago after her husband fell and she had to catch him.  She also notes that she has to help move him and pick him up frequently and feels that she may have pulled a muscle.     Physical Exam   Triage Vital Signs: ED Triage Vitals  Enc Vitals Group     BP 04/24/22 0907 (!) 165/89     Pulse Rate 04/24/22 0907 71     Resp 04/24/22 0907 20     Temp 04/24/22 0907 97.9 F (36.6 C)     Temp Source 04/24/22 0907 Oral     SpO2 04/24/22 0907 99 %     Weight 04/24/22 0905 233 lb 14.5 oz (106.1 kg)     Height 04/24/22 0905 '5\' 7"'$  (1.702 m)     Head Circumference --      Peak Flow --      Pain Score 04/24/22 0904 10     Pain Loc --      Pain Edu? --      Excl. in [place]? --     Most recent vital signs: Vitals:   04/24/22 0907  BP: (!) 165/89  Pulse: 71  Resp: 20  Temp: 97.9 F (36.6 C)  SpO2: 99%    General: Awake, no distress.  CV:  Good peripheral perfusion.  Regular rate and rhythm.  Normal distal pulses. Resp:  Normal effort.  Clear to auscultation bilaterally Abd:  No distention.  Other:  Tenderness over the right trapezius that reproduces her symptoms.  Worse with movement of the  right arm.   ED Results / Procedures / Treatments   Labs (all labs ordered are listed, but only abnormal results are displayed) Labs Reviewed  COMPREHENSIVE METABOLIC PANEL - Abnormal; Notable for the following components:      Result Value   Glucose, Bld 105 (*)    All other components within normal limits  CBC  TROPONIN I (HIGH SENSITIVITY)     EKG Interpreted by me Normal sinus rhythm rate of 71, normal axis intervals QRS ST segments and T waves.  No ischemic changes.  EKG from urgent care reviewed, shows sinus rhythm at a rate of 72.  Lead V3 is missing.  Lead V6 is obscured by heavy artifact.  Overall poor quality and nonischemic EKG.   RADIOLOGY    PROCEDURES:  Procedures   MEDICATIONS ORDERED IN ED: Medications  lidocaine (LIDODERM) 5 % 1 patch (has no administration in time range)     IMPRESSION / MDM / ASSESSMENT AND PLAN / ED COURSE  I reviewed the triage vital signs and the nursing  notes.  DDx: Essential hypertension, trapezius strain, pinched peripheral nerve.  Patient's presentation is most consistent with exacerbation of chronic illness.  Patient sent to the ED for evaluation of right arm pain which is clearly musculoskeletal on exam.  Blood pressure is moderately elevated in the ED, with about 165/90.  She is already on calcium channel blocker, ARB, and beta-blocker at doses that cannot be increased.  I will start her on HCTZ which she has taken in the past.  Recommend follow-up with primary care.   Considering the patient's symptoms, medical history, and physical examination today, I have low suspicion for ACS, PE, TAD, pneumothorax, carditis, mediastinitis, pneumonia, CHF, or sepsis.  Labs today are normal.  Exam is reassuring.  Stable for discharge       FINAL CLINICAL IMPRESSION(S) / ED DIAGNOSES   Final diagnoses:  Cervical radiculopathy  Hypertension, unspecified type     Rx / DC Orders   ED Discharge Orders          Ordered     hydrochlorothiazide (MICROZIDE) 12.5 MG capsule  Daily        04/24/22 0940    lidocaine (LIDODERM) 5 %  Every 12 hours        04/24/22 0940    diclofenac Sodium (VOLTAREN) 1 % GEL  4 times daily        04/24/22 0940             Note:  This document was prepared using Dragon voice recognition software and may include unintentional dictation errors.   Carrie Mew, MD 04/24/22 [phone]

## 2022-04-24 NOTE — ED Triage Notes (Signed)
Pt here from UC with elevated blood pressure with right-sided neck pain radiating into the right arm constantly for 7 days.  EKG' \\at'$  UC shows elevation within V6. Pt denies CP.

## 2022-04-24 NOTE — ED Provider Notes (Signed)
Patient presents for evaluation of elevated blood pressure reading with right-sided neck pain radiating into the right arm constantly for 7 days.  Blood pressure peaking at 200s over 100s.  Denies chest pain, shortness of breath, blurred vision, dizziness, lightheadedness, memory or speech changes.  History of hypertension, taking medications as directed.  History of diabetes and lupus.  Patient is being sent to the nearest emergency department for further evaluation and management.  EKG in office shows elevation within V6, discussed this with patient, declined EMS transport, to escort self to the nearest hospital.      Hans Eden, NP 04/24/22 [phone]

## 2022-04-24 NOTE — ED Triage Notes (Signed)
Patient is being discharged from the Urgent Care and sent to the Emergency Department via POV . Per A White NP, patient is in need of higher level of care due to HTN, EKG changes. Patient is aware and verbalizes understanding of plan of care.  Vitals:   04/24/22 0849  BP: (!) 184/99  Pulse: 71  Resp: 18  Temp: 97.8 F (36.6 C)  SpO2: 95%

## 2022-08-13 ENCOUNTER — Other Ambulatory Visit: Payer: Self-pay | Admitting: Cardiovascular Disease

## 2022-09-29 ENCOUNTER — Other Ambulatory Visit: Payer: Self-pay | Admitting: Cardiovascular Disease

## 2023-04-06 ENCOUNTER — Other Ambulatory Visit: Payer: Self-pay | Admitting: Internal Medicine

## 2023-04-06 DIAGNOSIS — R079 Chest pain, unspecified: Secondary | ICD-10-CM

## 2023-04-06 DIAGNOSIS — R0609 Other forms of dyspnea: Secondary | ICD-10-CM

## 2023-04-09 ENCOUNTER — Other Ambulatory Visit: Payer: Self-pay | Admitting: Cardiovascular Disease

## 2023-04-19 ENCOUNTER — Ambulatory Visit (INDEPENDENT_AMBULATORY_CARE_PROVIDER_SITE_OTHER): Payer: BC Managed Care – PPO

## 2023-04-19 DIAGNOSIS — R0609 Other forms of dyspnea: Secondary | ICD-10-CM | POA: Diagnosis not present

## 2023-04-19 DIAGNOSIS — R079 Chest pain, unspecified: Secondary | ICD-10-CM

## 2023-04-23 MED ORDER — TECHNETIUM TC 99M SESTAMIBI GENERIC - CARDIOLITE
33.0000 | Freq: Once | INTRAVENOUS | Status: AC | PRN
  Administered 2023-04-19: 33 via INTRAVENOUS

## 2023-04-23 MED ORDER — TECHNETIUM TC 99M SESTAMIBI GENERIC - CARDIOLITE
10.0000 | Freq: Once | INTRAVENOUS | Status: AC | PRN
  Administered 2023-04-19: 10 via INTRAVENOUS

## 2023-05-11 LAB — HM MAMMOGRAPHY

## 2023-05-23 ENCOUNTER — Other Ambulatory Visit: Payer: Self-pay

## 2023-05-23 ENCOUNTER — Ambulatory Visit
Admission: EM | Admit: 2023-05-23 | Discharge: 2023-05-23 | Disposition: A | Discharge status: Home / Self Care | Attending: Emergency Medicine | Admitting: Emergency Medicine

## 2023-05-23 ENCOUNTER — Encounter: Payer: Self-pay | Admitting: Emergency Medicine

## 2023-05-23 DIAGNOSIS — J101 Influenza due to other identified influenza virus with other respiratory manifestations: Secondary | ICD-10-CM

## 2023-05-23 LAB — POC COVID19/FLU A&B COMBO
Covid Antigen, POC: NEGATIVE
Influenza A Antigen, POC: POSITIVE — AB
Influenza B Antigen, POC: NEGATIVE

## 2023-05-23 MED ORDER — ONDANSETRON HCL 4 MG PO TABS: 4.0000 mg | ORAL_TABLET | Freq: Three times a day (TID) | ORAL | 0 refills | Status: DC | PRN

## 2023-05-23 MED ORDER — BENZONATATE 100 MG PO CAPS
100.0000 mg | ORAL_CAPSULE | Freq: Three times a day (TID) | ORAL | 0 refills | Status: DC
Start: 2023-05-23 — End: 2023-12-03

## 2023-05-23 MED ORDER — OSELTAMIVIR PHOSPHATE 75 MG PO CAPS: 75.0000 mg | ORAL_CAPSULE | Freq: Two times a day (BID) | ORAL | 0 refills | Status: DC

## 2023-05-23 MED ORDER — PROMETHAZINE-DM 6.25-15 MG/5ML PO SYRP: 5.0000 mL | ORAL_SOLUTION | Freq: Every evening | ORAL | 0 refills | Status: DC | PRN

## 2023-05-23 MED ORDER — LOPERAMIDE HCL 2 MG PO CAPS: 2.0000 mg | ORAL_CAPSULE | Freq: Four times a day (QID) | ORAL | 0 refills | Status: DC | PRN

## 2023-05-23 NOTE — Discharge Instructions (Addendum)
 Influenza A is a virus and should steadily improve in time it can take up to 7 to 10 days before you truly start to see a turnaround however things will get better  Begin Tamiflu every morning and every evening for 5 days to reduce the amount of virus in the body which helps to minimize symptoms  May use Zofran every 8 hours as needed to help with nausea and vomiting, increase fluids until able to tolerate food as normal  For diarrhea May use Imodium every 6 hours as needed  May use Tessalon pill every 8 hours for cough, may use cough syrup at bedtime as needed for additional comfort    You can take Tylenol and/or Ibuprofen as needed for fever reduction and pain relief.   For cough: honey 1/2 to 1 teaspoon (you can dilute the honey in water or another fluid).  You can also use guaifenesin and dextromethorphan for cough. You can use a humidifier for chest congestion and cough.  If you don't have a humidifier, you can sit in the bathroom with the hot shower running.      For sore throat: try warm salt water gargles, cepacol lozenges, throat spray, warm tea or water with lemon/honey, popsicles or ice, or OTC cold relief medicine for throat discomfort.   For congestion: take a daily anti-histamine like Zyrtec, Claritin, and a oral decongestant, such as pseudoephedrine.  You can also use Flonase 1-2 sprays in each nostril daily.   It is important to stay hydrated: drink plenty of fluids (water, gatorade/powerade/pedialyte, juices, or teas) to keep your throat moisturized and help further relieve irritation/discomfort.

## 2023-05-23 NOTE — ED Triage Notes (Addendum)
 Patient presents to [facility] for evaluation of cough starting Friay, Saturday started with headache, body aches, emesis (x1), diarrhea (x1 this morning), fatigue and malaise.  Patient has had recent Flu exposure

## 2023-05-23 NOTE — ED Provider Notes (Signed)
 Sheila Eaton    CSN: [id number] Arrival date & time: 05/23/23  0950      History   Chief Complaint Chief Complaint  Patient presents with   Emesis    HPI Sheila Eaton is a 63 y.o. female.   Patient presents for evaluation of increased fatigue, malaise, productive cough causing chest discomfort and sore throat, intermittent headaches and vomiting and diarrhea beginning 2 days ago.  Known exposure to influenza.  Has attempted use of TheraFlu and Alka-Seltzer.  Difficulty tolerating food but able to tolerate fluids.  Denies shortness of breath or wheezing.  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/03/2019   Myalgia 05/03/2019   Screening for osteoporosis 05/03/2019   Lumbar spondylosis 11/11/2018   Trochanteric bursitis, left hip 11/11/2018   Trochanteric bursitis, right hip 11/11/2018    Past Surgical History:  Procedure Laterality Date   ABDOMINAL HYSTERECTOMY     COLONOSCOPY WITH PROPOFOL N/A 10/21/2018   Procedure: COLONOSCOPY WITH PROPOFOL;  Surgeon: Wyline Mood, MD;  Location: [facility] ENDOSCOPY;  Service: Gastroenterology;  Laterality: N/A;   FLEXIBLE SIGMOIDOSCOPY N/A 09/22/2019   Procedure: FLEXIBLE SIGMOIDOSCOPY;  Surgeon: Wyline Mood, MD;  Location: [facility] ENDOSCOPY;  Service: Gastroenterology;  Laterality: N/A;   LEG SURGERY     VASCULAR SURGERY      OB History   No obstetric history on file.      Home Medications    Prior to Admission medications   Medication Sig Start Date End Date Taking? Authorizing Provider  benzonatate (TESSALON) 100 MG capsule Take 1 capsule (100 mg total) by mouth every 8 (eight) hours. 05/23/23  Yes Kagan Hietpas, Elita Boone, NP   loperamide (IMODIUM) 2 MG capsule Take 1 capsule (2 mg total) by mouth 4 (four) times daily as needed for diarrhea or loose stools. 05/23/23  Yes Chantell Kunkler, Elita Boone, NP  oseltamivir (TAMIFLU) 75 MG capsule Take 1 capsule (75 mg total) by mouth every 12 (twelve) hours. 05/23/23  Yes Tamie Minteer R, NP  promethazine-dextromethorphan (PROMETHAZINE-DM) 6.25-15 MG/5ML syrup Take 5 mLs by mouth at bedtime as needed. 05/23/23  Yes Ronee Ranganathan R, NP  amLODipine (NORVASC) 5 MG tablet Take 5 mg by mouth 2 (two) times daily.    [provider]  aspirin EC 81 MG tablet Take by mouth.    [provider]  Calcium Carb-Cholecalciferol 500-10 MG-MCG TABS Take 1 tablet by mouth daily.    [provider]  celecoxib (CELEBREX) 200 MG capsule Take 200 mg by mouth daily.    [provider]  diclofenac Sodium (VOLTAREN) 1 % GEL Apply 2 g topically 4 (four) times daily. 04/24/22   Sharman Cheek, MD  dicyclomine (BENTYL) 10 MG capsule Take 1 capsule (10 mg total) by mouth 4 (four) times daily -  before meals and at bedtime. 10/26/19 01/24/20  Wyline Mood, MD  fluticasone Aleda Grana) 50 MCG/ACT nasal spray SMARTSIG:2 Spray(s) Both Nares Daily PRN 04/12/19   [provider]  hydrochlorothiazide (MICROZIDE) 12.5 MG capsule Take 1 capsule (12.5 mg total) by mouth daily. 04/24/22 05/24/22  Sharman Cheek, MD  hydroxychloroquine (PLAQUENIL) 200 MG tablet 1 tab twice a day for lupus ,  180 tabs for 90 day 05/03/19   [provider]  lidocaine (LIDODERM) 5 % Place 1 patch onto the skin every 12 (twelve) hours. Remove & Discard patch within 12 hours or as directed by MD 04/24/22   Sharman Cheek, MD  metFORMIN (GLUCOPHAGE) 500 MG tablet Take by mouth daily.    [provider]  metoprolol tartrate (LOPRESSOR) 25 MG tablet Take 25 mg by mouth 2 (two) times daily. 06/08/19   [provider]  ondansetron (ZOFRAN) 4 MG tablet Take 1 tablet (4 mg total) by mouth every 8  (eight) hours as needed for nausea or vomiting. 05/23/23   Malak Orantes, Elita Boone, NP  OZEMPIC, 0.25 OR 0.5 MG/DOSE, 2 MG/3ML SOPN SMARTSIG:0.25 Milligram(s) SUB-Q Once a Week 01/04/22   [provider]  rosuvastatin (CRESTOR) 20 MG tablet TAKE 1 TABLET BY MOUTH EVERY DAY 04/12/23   Adrian Blackwater A, MD  valsartan (DIOVAN) 320 MG tablet Take 320 mg by mouth daily. 06/08/19   [provider]    Family History Family History  Problem Relation Age of Onset   Breast cancer Neg Hx     Social History Social History   Tobacco Use   Smoking status: Former   Smokeless tobacco: Never  Vaping Use   Vaping status: Never Used  Substance Use Topics   Alcohol use: Not Currently    Comment: occassional   Drug use: No     Allergies   Other   Review of Systems Review of Systems   Physical Exam Triage Vital Signs ED Triage Vitals  Encounter Vitals Group     BP 05/23/23 1013 (!) 141/101     Systolic BP Percentile --      Diastolic BP Percentile --      Pulse Rate 05/23/23 1013 84     Resp 05/23/23 1013 18     Temp 05/23/23 1013 99.7 F (37.6 C)     Temp Source 05/23/23 1013 Oral     SpO2 05/23/23 1013 96 %     Weight --      Height --      Head Circumference --      Peak Flow --      Pain Score 05/23/23 1011 7     Pain Loc --      Pain Education --      Exclude from Growth Chart --    No data found.  Updated Vital Signs BP (!) 141/101 (BP Location: Right Arm)   Pulse 84   Temp 99.7 F (37.6 C) (Oral)   Resp 18   SpO2 96%   Visual Acuity Right Eye Distance:   Left Eye Distance:   Bilateral Distance:    Right Eye Near:   Left Eye Near:    Bilateral Near:     Physical Exam Constitutional:      Appearance: She is ill-appearing.  HENT:     Head: Normocephalic.     Right Ear: Tympanic membrane, ear canal and external ear normal.     Left Ear: Tympanic membrane, ear canal and external ear normal.     Nose: Congestion present. No rhinorrhea.      Mouth/Throat:     Mouth: Mucous membranes are moist.     Pharynx: Oropharynx is clear. No oropharyngeal exudate or posterior oropharyngeal erythema.  Eyes:     Extraocular Movements: Extraocular movements intact.  Cardiovascular:     Rate and Rhythm: Normal rate.     Pulses: Normal pulses.  Heart sounds: Normal heart sounds.  Pulmonary:     Effort: Pulmonary effort is normal.     Breath sounds: Normal breath sounds.  Chest:     Comments: Tenderness present to the left chest wall, chest wall symmetrical, no ecchymosis swelling or deformity Musculoskeletal:     Cervical back: Normal range of motion and neck supple.  Neurological:     Mental Status: She is alert and oriented to person, place, and time. Mental status is at baseline.      UC Treatments / Results  Labs (all labs ordered are listed, but only abnormal results are displayed) Labs Reviewed  POC COVID19/FLU A&B COMBO - Abnormal; Notable for the following components:      Result Value   Influenza A Antigen, POC Positive (*)    All other components within normal limits    EKG   Radiology No results found.  Procedures Procedures (including critical care time)  Medications Ordered in UC Medications - No data to display  Initial Impression / Assessment and Plan / UC Course  I have reviewed the triage vital signs and the nursing notes.  Pertinent labs & imaging results that were available during my care of the patient were reviewed by me and considered in my medical decision making (see chart for details).  Influenza A  Patient is in no signs of distress nor toxic appearing.  Vital signs are stable.  Low suspicion for pneumonia, pneumothorax or bronchitis and therefore will defer imaging.  COVID-negative.  Chest discomfort is reproducible, most likely muscular, discussed.  Prescribed Tamiflu, Tessalon, Promethazine DM, Zofran and Imodium. May use additional over-the-counter medications as needed for supportive  care.  May follow-up with urgent care as needed if symptoms persist or worsen.   Final Clinical Impressions(s) / UC Diagnoses   Final diagnoses:  Influenza A     Discharge Instructions      Influenza A is a virus and should steadily improve in time it can take up to 7 to 10 days before you truly start to see a turnaround however things will get better  Begin Tamiflu every morning and every evening for 5 days to reduce the amount of virus in the body which helps to minimize symptoms  May use Zofran every 8 hours as needed to help with nausea and vomiting, increase fluids until able to tolerate food as normal  For diarrhea May use Imodium every 6 hours as needed  May use Tessalon pill every 8 hours for cough, may use cough syrup at bedtime as needed for additional comfort    You can take Tylenol and/or Ibuprofen as needed for fever reduction and pain relief.   For cough: honey 1/2 to 1 teaspoon (you can dilute the honey in water or another fluid).  You can also use guaifenesin and dextromethorphan for cough. You can use a humidifier for chest congestion and cough.  If you don't have a humidifier, you can sit in the bathroom with the hot shower running.      For sore throat: try warm salt water gargles, cepacol lozenges, throat spray, warm tea or water with lemon/honey, popsicles or ice, or OTC cold relief medicine for throat discomfort.   For congestion: take a daily anti-histamine like Zyrtec, Claritin, and a oral decongestant, such as pseudoephedrine.  You can also use Flonase 1-2 sprays in each nostril daily.   It is important to stay hydrated: drink plenty of fluids (water, gatorade/powerade/pedialyte, juices, or teas) to keep your throat moisturized  and help further relieve irritation/discomfort.    ED Prescriptions     Medication Sig Dispense Auth. Provider   oseltamivir (TAMIFLU) 75 MG capsule Take 1 capsule (75 mg total) by mouth every 12 (twelve) hours. 10 capsule Shizuye Rupert,  Quincey Quesinberry R, NP   benzonatate (TESSALON) 100 MG capsule Take 1 capsule (100 mg total) by mouth every 8 (eight) hours. 21 capsule Latravis Grine R, NP   promethazine-dextromethorphan (PROMETHAZINE-DM) 6.25-15 MG/5ML syrup Take 5 mLs by mouth at bedtime as needed. 118 mL Atlee Villers R, NP   ondansetron (ZOFRAN) 4 MG tablet  (Status: Discontinued) Take 1 tablet (4 mg total) by mouth every 8 (eight) hours as needed for nausea or vomiting. 20 tablet Cyndy Braver, Hansel Starling R, NP   loperamide (IMODIUM) 2 MG capsule Take 1 capsule (2 mg total) by mouth 4 (four) times daily as needed for diarrhea or loose stools. 12 capsule Keymora Grillot R, NP   ondansetron (ZOFRAN) 4 MG tablet Take 1 tablet (4 mg total) by mouth every 8 (eight) hours as needed for nausea or vomiting. 20 tablet Valinda Hoar, NP      PDMP not reviewed this encounter.   Valinda Hoar, NP 05/23/23 1133

## 2023-07-08 ENCOUNTER — Other Ambulatory Visit: Payer: Self-pay | Admitting: Cardiovascular Disease

## 2023-12-03 ENCOUNTER — Ambulatory Visit: Admitting: General Practice

## 2023-12-03 ENCOUNTER — Encounter: Payer: Self-pay | Admitting: General Practice

## 2023-12-03 VITALS — BP 138/78 | HR 66 | Temp 97.8°F | Ht 68.0 in | Wt 241.4 lb

## 2023-12-03 DIAGNOSIS — E1159 Type 2 diabetes mellitus with other circulatory complications: Secondary | ICD-10-CM | POA: Diagnosis not present

## 2023-12-03 DIAGNOSIS — E1165 Type 2 diabetes mellitus with hyperglycemia: Secondary | ICD-10-CM

## 2023-12-03 DIAGNOSIS — E1169 Type 2 diabetes mellitus with other specified complication: Secondary | ICD-10-CM

## 2023-12-03 DIAGNOSIS — Z1159 Encounter for screening for other viral diseases: Secondary | ICD-10-CM

## 2023-12-03 DIAGNOSIS — Z23 Encounter for immunization: Secondary | ICD-10-CM

## 2023-12-03 DIAGNOSIS — Z7689 Persons encountering health services in other specified circumstances: Secondary | ICD-10-CM | POA: Insufficient documentation

## 2023-12-03 DIAGNOSIS — E785 Hyperlipidemia, unspecified: Secondary | ICD-10-CM

## 2023-12-03 DIAGNOSIS — Z114 Encounter for screening for human immunodeficiency virus [HIV]: Secondary | ICD-10-CM

## 2023-12-03 DIAGNOSIS — I152 Hypertension secondary to endocrine disorders: Secondary | ICD-10-CM

## 2023-12-03 DIAGNOSIS — M329 Systemic lupus erythematosus, unspecified: Secondary | ICD-10-CM

## 2023-12-03 NOTE — Assessment & Plan Note (Signed)
 Hemoglobin A1c pending. Urine ACR pending. CMP pending. Tried Metformin for 1.5 years and stopped it due to GI side effects.  Did Ozempic 0.25 mg once weekly for a year but stopped it due to cost.  Consider Mounjaro or Metformin XR.

## 2023-12-03 NOTE — Assessment & Plan Note (Signed)
 Chronic.  In remission for 15 years.  Following with rheumatology.  Reviewed notes and labs from June.   Continue Hydroxychloroquine 200 mg BID.  Compliant with yearly eye exam.

## 2023-12-03 NOTE — Progress Notes (Signed)
 New Patient Office Visit  Subjective    Patient ID: Sheila Eaton, female    DOB: December 08, 1960  Age: 63 y.o. MRN: [id number]  CC:  Chief Complaint  Patient presents with   New Patient (Initial Visit)    Wants shingles vaccine done today    HPI Sheila Eaton is a 63 y.o. female presents to establish care.   Previous PCP/physical/labs: Dr. Weyman  Discussed the use of AI scribe software for clinical note transcription with the patient, who gave verbal consent to proceed.  History of Present Illness Initially diagnosed with prediabetes, she was started on metformin but discontinued it due to gastrointestinal side effects. She was then switched to Ozempic, which she tolerated well but had to stop due to insurance coverage issues. Her last dose of Ozempic was about a month ago. Her hemoglobin A1c was previously 6.1, indicating prediabetes, but she recalls it being higher in the past, possibly above 6.5, indicating type 2 diabetes. She maintains a diet low in carbohydrates, avoiding bread, potatoes, and rice, and occasionally consumes fruits like pears, plums, peaches, and bananas.  She has a history of hypertension, which she describes as her 'biggest issue'. Despite being on four antihypertensive medications (valsartan 320 mg, amlodipine 5 mg twice daily, HCTZ 25 mg once daily, and metoprolol 25 mg twice daily), her blood pressure remains uncontrolled. She denies symptoms such as headaches, blurred vision, or chest pain. She monitors her blood pressure at home and notes fluctuations based on dietary intake. She has refused a sleep apnea test in the past despite high morning blood pressure readings.  She has a history of systemic lupus erythematosus (SLE) and has been in remission for about 15 years. She continues to take hydroxychloroquine twice daily and undergoes yearly eye exams.  She is on Crestor 20 mg daily for cholesterol management and takes a baby aspirin  daily as part of her regimen. She believes her cholesterol levels are currently well-controlled.  She is currently working and lives alone following the passing of her husband.    Outpatient Encounter Medications as of 12/03/2023  Medication Sig   ACCU-CHEK GUIDE TEST test strip 1 each by Other route.   Accu-Chek Softclix Lancets lancets SMARTSIG:Topical   amLODipine (NORVASC) 5 MG tablet Take 5 mg by mouth 2 (two) times daily.   aspirin EC 81 MG tablet Take by mouth.   celecoxib (CELEBREX) 200 MG capsule Take 200 mg by mouth daily.   fluticasone (FLONASE) 50 MCG/ACT nasal spray SMARTSIG:2 Spray(s) Both Nares Daily PRN   hydrochlorothiazide  (HYDRODIURIL ) 25 MG tablet Take 25 mg by mouth daily.   hydroxychloroquine (PLAQUENIL) 200 MG tablet 1 tab twice a day for lupus , 180 tabs for 90 day   metoprolol tartrate (LOPRESSOR) 25 MG tablet Take 25 mg by mouth 2 (two) times daily.   rosuvastatin (CRESTOR) 20 MG tablet TAKE 1 TABLET BY MOUTH EVERY DAY   valsartan (DIOVAN) 320 MG tablet Take 320 mg by mouth daily.   [DISCONTINUED] amLODipine (NORVASC) 10 MG tablet Take 10 mg by mouth daily. (Patient not taking: Reported on 12/03/2023)   [DISCONTINUED] benzonatate  (TESSALON ) 100 MG capsule Take 1 capsule (100 mg total) by mouth every 8 (eight) hours.   [DISCONTINUED] Calcium Carb-Cholecalciferol 500-10 MG-MCG TABS Take 1 tablet by mouth daily.   [DISCONTINUED] diclofenac  Sodium (VOLTAREN ) 1 % GEL Apply 2 g topically 4 (four) times daily.   [DISCONTINUED] dicyclomine  (BENTYL ) 10 MG capsule Take 1 capsule (10 mg total) by mouth 4 (  four) times daily -  before meals and at bedtime.   [DISCONTINUED] hydrochlorothiazide  (MICROZIDE ) 12.5 MG capsule Take 1 capsule (12.5 mg total) by mouth daily.   [DISCONTINUED] lidocaine  (LIDODERM ) 5 % Place 1 patch onto the skin every 12 (twelve) hours. Remove & Discard patch within 12 hours or as directed by MD   [DISCONTINUED] loperamide  (IMODIUM ) 2 MG capsule Take 1  capsule (2 mg total) by mouth 4 (four) times daily as needed for diarrhea or loose stools.   [DISCONTINUED] metFORMIN (GLUCOPHAGE) 500 MG tablet Take by mouth daily.   [DISCONTINUED] ondansetron  (ZOFRAN ) 4 MG tablet Take 1 tablet (4 mg total) by mouth every 8 (eight) hours as needed for nausea or vomiting.   [DISCONTINUED] oseltamivir  (TAMIFLU ) 75 MG capsule Take 1 capsule (75 mg total) by mouth every 12 (twelve) hours.   [DISCONTINUED] OZEMPIC, 0.25 OR 0.5 MG/DOSE, 2 MG/3ML SOPN SMARTSIG:0.25 Milligram(s) SUB-Q Once a Week   [DISCONTINUED] promethazine -dextromethorphan (PROMETHAZINE -DM) 6.25-15 MG/5ML syrup Take 5 mLs by mouth at bedtime as needed.   No facility-administered encounter medications on file as of 12/03/2023.    Past Medical History:  Diagnosis Date   Allergy    Arthritis    Blood transfusion without reported diagnosis    Collagen vascular disease (HCC)    lupus   Diabetes mellitus without complication (HCC)    Diverticulitis    History of chicken pox    Hypertension    Lupus    Migraines     Past Surgical History:  Procedure Laterality Date   ABDOMINAL HYSTERECTOMY     COLONOSCOPY WITH PROPOFOL  N/A 10/21/2018   Procedure: COLONOSCOPY WITH PROPOFOL ;  Surgeon: Therisa Bi, MD;  Location: [facility] ENDOSCOPY;  Service: Gastroenterology;  Laterality: N/A;   FLEXIBLE SIGMOIDOSCOPY N/A 09/22/2019   Procedure: FLEXIBLE SIGMOIDOSCOPY;  Surgeon: Therisa Bi, MD;  Location: [facility] ENDOSCOPY;  Service: Gastroenterology;  Laterality: N/A;   LEG SURGERY     VASCULAR SURGERY      Family History  Problem Relation Age of Onset   Hypertension Mother    Arthritis Mother    Miscarriages / [place] Mother    Stroke Father    Hypertension Father    Diabetes Father    Hypertension Sister    Arthritis Sister    Heart attack Sister    Hypertension Sister    Hypertension Maternal Grandmother    Heart attack Maternal Grandmother    Heart disease Maternal Grandmother    Arthritis  Maternal Grandmother    Stroke Maternal Grandfather    Hypertension Maternal Grandfather    Diabetes Maternal Grandfather    Alcohol abuse Maternal Grandfather    Breast cancer Neg Hx     Social History   Socioeconomic History   Marital status: Married    Spouse name: Not on file   Number of children: Not on file   Years of education: Not on file   Highest education level: Some college, no degree  Occupational History   Not on file  Tobacco Use   Smoking status: Former   Smokeless tobacco: Never  Vaping Use   Vaping status: Never Used  Substance and Sexual Activity   Alcohol use: Not Currently    Comment: occassional   Drug use: No   Sexual activity: Not Currently  Other Topics Concern   Not on file  Social History Narrative   Not on file   Social Drivers of Health   Financial Resource Strain: Not on file  Food Insecurity: Not on file  Transportation Needs: Not on file  Physical Activity: Not on file  Stress: Not on file  Social Connections: Not on file  Intimate Partner Violence: Not on file    Review of Systems  Constitutional:  Negative for chills and fever.  Respiratory:  Negative for shortness of breath.   Cardiovascular:  Negative for chest pain.  Gastrointestinal:  Negative for abdominal pain, constipation, diarrhea, heartburn, nausea and vomiting.  Genitourinary:  Negative for dysuria, frequency and urgency.  Neurological:  Negative for dizziness and headaches.  Endo/Heme/Allergies:  Negative for polydipsia.  Psychiatric/Behavioral:  Negative for depression and suicidal ideas. The patient is not nervous/anxious.         Objective    BP 138/78   Pulse 66   Temp 97.8 F (36.6 C) (Temporal)   Ht 5' 8 (1.727 m)   Wt 241 lb 6.4 oz (109.5 kg)   SpO2 99%   BMI 36.70 kg/m   Physical Exam Vitals and nursing note reviewed.  Constitutional:      Appearance: Normal appearance.  Cardiovascular:     Rate and Rhythm: Normal rate and regular rhythm.      Pulses: Normal pulses.     Heart sounds: Normal heart sounds.  Pulmonary:     Effort: Pulmonary effort is normal.     Breath sounds: Normal breath sounds.  Musculoskeletal:     Right lower leg: No edema.     Left lower leg: No edema.  Neurological:     Mental Status: She is alert and oriented to person, place, and time.  Psychiatric:        Mood and Affect: Mood normal.        Behavior: Behavior normal.        Thought Content: Thought content normal.        Judgment: Judgment normal.         Assessment & Plan:  Type 2 diabetes mellitus with hyperglycemia, unspecified whether long term insulin use (HCC) Assessment & Plan: Hemoglobin A1c pending. Urine ACR pending. CMP pending. Tried Metformin for 1.5 years and stopped it due to GI side effects.  Did Ozempic 0.25 mg once weekly for a year but stopped it due to cost.  Consider Mounjaro or Metformin XR.   Orders: -     Microalbumin / creatinine urine ratio -     Hemoglobin A1c -     Comprehensive metabolic panel with GFR -     CBC  Establishing care with new doctor, encounter for Assessment & Plan: EMR reviewed briefly.    Need for shingles vaccine -     Varicella-zoster vaccine IM  Hypertension associated with diabetes [facility]) Assessment & Plan: Not at goal.   Currently managed on Valsartan 320 mg once daily, amlodipine 5 mg BID, hydrochlorothiazide  25 mg once daily and metoprolol 25 mg BID.   Patient is asked to monitor BP at home or work, several times per month and return with written values at next office visit.  BMP pending.   Consider adding spironolactone .  F/u in 2 weeks.   Hyperlipidemia associated with type 2 diabetes mellitus (HCC) Assessment & Plan: Continue crestor 20 mg once daily.   Lipid panel at next visit.    SLE (systemic lupus erythematosus related syndrome) (HCC) Assessment & Plan: Chronic.  In remission for 15 years.  Following with rheumatology.  Reviewed notes and labs from  June.   Continue Hydroxychloroquine 200 mg BID.  Compliant with yearly eye exam.   Screening for HIV (  human immunodeficiency virus) -     HIV Antibody (routine testing w rflx)  Need for hepatitis C screening test -     Hepatitis C antibody     Return in about 2 weeks (around 12/17/2023) for hypertension.   Carrol Aurora, NP

## 2023-12-03 NOTE — Patient Instructions (Signed)
 Stop by the lab prior to leaving today. I will notify you of your results once received.   Start monitoring your blood pressure daily, around the same time of day, for the next 2-3 weeks.  Ensure that you have rested for 30 minutes prior to checking your blood pressure.   Record your readings and notify me if you see numbers consistently at or above 130 on top and/or 90 on bottom.  Start checking your blood sugar levels.  Appropriate times to check your blood sugar levels are:  -Before any meal (breakfast, lunch, dinner) -Two hours after any meal (breakfast, lunch, dinner) -Bedtime  Record your readings and notify me if you continue to consistently run at or above 150.   I will be in touch regarding the labs and adding the spironolactone  12.5 mg once daily.   Follow up in 2 weeks.   It was a pleasure to meet you today! Please don't hesitate to contact me with any questions. Welcome to [facility]!

## 2023-12-03 NOTE — Assessment & Plan Note (Addendum)
 Not at goal.   Currently managed on Valsartan 320 mg once daily, amlodipine 5 mg BID, hydrochlorothiazide  25 mg once daily and metoprolol 25 mg BID.   Patient is asked to monitor BP at home or work, several times per month and return with written values at next office visit.  BMP pending.   Consider adding spironolactone .  F/u in 2 weeks.

## 2023-12-03 NOTE — Assessment & Plan Note (Signed)
 EMR reviewed briefly.

## 2023-12-03 NOTE — Assessment & Plan Note (Signed)
 Continue crestor 20 mg once daily.   Lipid panel at next visit.

## 2023-12-04 ENCOUNTER — Ambulatory Visit: Payer: Self-pay | Admitting: General Practice

## 2023-12-04 DIAGNOSIS — I152 Hypertension secondary to endocrine disorders: Secondary | ICD-10-CM

## 2023-12-04 DIAGNOSIS — E1165 Type 2 diabetes mellitus with hyperglycemia: Secondary | ICD-10-CM

## 2023-12-04 LAB — COMPREHENSIVE METABOLIC PANEL WITH GFR
AG Ratio: 1.5 (calc) (ref 1.0–2.5)
ALT: 26 U/L (ref 6–29)
AST: 23 U/L (ref 10–35)
Albumin: 4.7 g/dL (ref 3.6–5.1)
Alkaline phosphatase (APISO): 68 U/L (ref 37–153)
BUN: 13 mg/dL (ref 7–25)
CO2: 28 mmol/L (ref 20–32)
Calcium: 9.6 mg/dL (ref 8.6–10.4)
Chloride: 99 mmol/L (ref 98–110)
Creat: 0.77 mg/dL (ref 0.50–1.05)
Globulin: 3.2 g/dL (ref 1.9–3.7)
Glucose, Bld: 105 mg/dL — ABNORMAL HIGH (ref 65–99)
Potassium: 3.7 mmol/L (ref 3.5–5.3)
Sodium: 137 mmol/L (ref 135–146)
Total Bilirubin: 0.5 mg/dL (ref 0.2–1.2)
Total Protein: 7.9 g/dL (ref 6.1–8.1)
eGFR: 87 mL/min/1.73m2 (ref >=60)

## 2023-12-04 LAB — HIV ANTIBODY (ROUTINE TESTING W REFLEX)
HIV 1&2 Ab, 4th Generation: NONREACTIVE
HIV FINAL INTERPRETATION: NEGATIVE

## 2023-12-04 LAB — CBC
HCT: 41.1 % (ref 35.0–45.0)
Hemoglobin: 13.5 g/dL (ref 11.7–15.5)
MCH: 29.1 pg (ref 27.0–33.0)
MCHC: 32.8 g/dL (ref 32.0–36.0)
MCV: 88.6 fL (ref 80.0–100.0)
MPV: 11.6 fL (ref 7.5–12.5)
Platelets: 260 Thousand/uL (ref 140–400)
RBC: 4.64 Million/uL (ref 3.80–5.10)
RDW: 13.2 % (ref 11.0–15.0)
WBC: 5.7 Thousand/uL (ref 3.8–10.8)

## 2023-12-04 LAB — HEPATITIS C ANTIBODY: Hepatitis C Ab: NONREACTIVE

## 2023-12-04 LAB — MICROALBUMIN / CREATININE URINE RATIO
Creatinine, Urine: 38 mg/dL (ref 20–275)
Microalb Creat Ratio: 21 mg/g{creat} (ref <30)
Microalb, Ur: 0.8 mg/dL

## 2023-12-04 LAB — HEMOGLOBIN A1C
Hgb A1c MFr Bld: 6.4 % — ABNORMAL HIGH (ref <5.7)
Mean Plasma Glucose: 137 mg/dL
eAG (mmol/L): 7.6 mmol/L

## 2023-12-04 MED ORDER — SPIRONOLACTONE 25 MG PO TABS
12.5000 mg | ORAL_TABLET | Freq: Every day | ORAL | 1 refills | Status: DC
Start: 2023-12-04 — End: 2024-01-19

## 2023-12-07 MED ORDER — METFORMIN HCL ER 500 MG PO TB24: 500.0000 mg | ORAL_TABLET | Freq: Every day | ORAL | 2 refills | Status: DC

## 2023-12-16 ENCOUNTER — Encounter: Payer: Self-pay | Admitting: General Practice

## 2023-12-16 ENCOUNTER — Ambulatory Visit (INDEPENDENT_AMBULATORY_CARE_PROVIDER_SITE_OTHER): Admitting: General Practice

## 2023-12-16 ENCOUNTER — Other Ambulatory Visit (HOSPITAL_COMMUNITY): Payer: Self-pay

## 2023-12-16 ENCOUNTER — Ambulatory Visit: Payer: Self-pay | Admitting: General Practice

## 2023-12-16 ENCOUNTER — Telehealth: Payer: Self-pay

## 2023-12-16 VITALS — BP 124/68 | HR 65 | Temp 97.2°F | Ht 68.0 in | Wt 238.0 lb

## 2023-12-16 DIAGNOSIS — E1165 Type 2 diabetes mellitus with hyperglycemia: Secondary | ICD-10-CM

## 2023-12-16 DIAGNOSIS — E1169 Type 2 diabetes mellitus with other specified complication: Secondary | ICD-10-CM

## 2023-12-16 DIAGNOSIS — E1159 Type 2 diabetes mellitus with other circulatory complications: Secondary | ICD-10-CM

## 2023-12-16 DIAGNOSIS — I152 Hypertension secondary to endocrine disorders: Secondary | ICD-10-CM | POA: Diagnosis not present

## 2023-12-16 DIAGNOSIS — Z7984 Long term (current) use of oral hypoglycemic drugs: Secondary | ICD-10-CM

## 2023-12-16 DIAGNOSIS — E785 Hyperlipidemia, unspecified: Secondary | ICD-10-CM

## 2023-12-16 LAB — LIPID PANEL
Cholesterol: 125 mg/dL (ref 0–200)
HDL: 56.5 mg/dL (ref >39.00)
LDL Cholesterol: 59 mg/dL (ref 0–99)
NonHDL: 68.5
Total CHOL/HDL Ratio: 2
Triglycerides: 46 mg/dL (ref 0.0–149.0)
VLDL: 9.2 mg/dL (ref 0.0–40.0)

## 2023-12-16 LAB — BASIC METABOLIC PANEL WITH GFR
BUN: 14 mg/dL (ref 6–23)
CO2: 29 meq/L (ref 19–32)
Calcium: 9.6 mg/dL (ref 8.4–10.5)
Chloride: 101 meq/L (ref 96–112)
Creatinine, Ser: 0.82 mg/dL (ref 0.40–1.20)
GFR: 75.9 mL/min (ref >60.00)
Glucose, Bld: 140 mg/dL — ABNORMAL HIGH (ref 70–99)
Potassium: 4 meq/L (ref 3.5–5.1)
Sodium: 138 meq/L (ref 135–145)

## 2023-12-16 MED ORDER — TIRZEPATIDE 2.5 MG/0.5ML ~~LOC~~ SOAJ: 2.5000 mg | SUBCUTANEOUS | 0 refills | Status: DC

## 2023-12-16 NOTE — Telephone Encounter (Signed)
 Pharmacy Patient Advocate Encounter   Received notification from Onbase that prior authorization for Mounjaro  2.5 is required/requested.   Insurance verification completed.   The patient is insured through [facility] .   Per test claim: PA required; PA started via CoverMyMeds. KEY [id number] . Waiting for clinical questions to populate.

## 2023-12-16 NOTE — Progress Notes (Signed)
 Established Patient Office Visit  Subjective   Patient ID: Sheila Eaton, female    DOB: 03/08/1961  Age: 63 y.o. MRN: [id number]  Chief Complaint  Patient presents with   Hypertension    Patient here today for BP f/u; patient states BP is better.     Hypertension Pertinent negatives include no chest pain, headaches or shortness of breath.   Sheila Eaton is a 63 year old with past medical history of HTN, DM2, HLD presents today for a follow up on BP.   Discussed the use of AI scribe software for clinical note transcription with the patient, who gave verbal consent to proceed.  History of Present Illness She reports that her home blood pressure readings have decreased from the 140s to the upper 120s since starting spironolactone . Her current medications for hypertension include amlodipine 5 mg twice daily, HCTZ 25 mg once daily, metoprolol 25 mg twice daily, and spironolactone  12.5 mg once daily.   For diabetes management, she is currently taking metformin  with breakfast. Fasting blood sugars in the morning range from the one-teens to 140s, but post-lunch readings are lower, often in the 70s, especially after eating salads. Her A1c has increased from 6.0% to 6.4% since stopping Ozempic due to cost issues. She experiences diarrhea as a side effect of metformin . She has been diagnosed with diabetes for at least six to seven years.  Patient Active Problem List   Diagnosis Date Noted   Hyperlipidemia associated with type 2 diabetes mellitus (HCC) 12/03/2023   Establishing care with new doctor, encounter for 12/03/2023   SLE (systemic lupus erythematosus related syndrome) (HCC) 12/03/2023   Type 2 diabetes mellitus with hyperglycemia (HCC) 12/03/2023   Body aches 02/20/2022   Hypertension associated with diabetes (HCC) 08/03/2019   Myalgia 05/03/2019   Screening for osteoporosis 05/03/2019   Lumbar spondylosis 11/11/2018   Trochanteric bursitis, left hip  11/11/2018   Trochanteric bursitis, right hip 11/11/2018   Past Medical History:  Diagnosis Date   Allergy    Arthritis    Blood transfusion without reported diagnosis    Collagen vascular disease    lupus   Diabetes mellitus without complication (HCC)    Diverticulitis    History of chicken pox    Hypertension    Lupus    Migraines    Past Surgical History:  Procedure Laterality Date   ABDOMINAL HYSTERECTOMY     COLONOSCOPY WITH PROPOFOL  N/A 10/21/2018   Procedure: COLONOSCOPY WITH PROPOFOL ;  Surgeon: Sheila Bi, MD;  Location: [facility] ENDOSCOPY;  Service: Gastroenterology;  Laterality: N/A;   FLEXIBLE SIGMOIDOSCOPY N/A 09/22/2019   Procedure: FLEXIBLE SIGMOIDOSCOPY;  Surgeon: Sheila Bi, MD;  Location: [facility] ENDOSCOPY;  Service: Gastroenterology;  Laterality: N/A;   LEG SURGERY     VASCULAR SURGERY     Allergies  Allergen Reactions   Citrus Dermatitis   Other     Rash with citrus and chocolate         12/16/2023    8:45 AM 12/03/2023    4:16 PM  Depression screen PHQ 2/9  Decreased Interest 0 0  Down, Depressed, Hopeless 0 0  PHQ - 2 Score 0 0  Altered sleeping 1 1  Tired, decreased energy 1 1  Change in appetite 0 1  Feeling bad or failure about yourself  0 0  Trouble concentrating 0 0  Moving slowly or fidgety/restless 0 0  Suicidal thoughts 0 0  PHQ-9 Score 2 3  Difficult doing work/chores Not difficult at  all Not difficult at all       12/16/2023    8:46 AM 12/03/2023    4:16 PM  GAD 7 : Generalized Anxiety Score  Nervous, Anxious, on Edge 0 0  Control/stop worrying 0 1  Worry too much - different things 1 1  Trouble relaxing 0 0  Restless 0 0  Easily annoyed or irritable 0 0  Afraid - awful might happen 0 0  Total GAD 7 Score 1 2  Anxiety Difficulty Not difficult at all Not difficult at all      Review of Systems  Constitutional:  Negative for chills and fever.  Respiratory:  Negative for shortness of breath.   Cardiovascular:  Negative for chest  pain.  Gastrointestinal:  Negative for abdominal pain, constipation, diarrhea, heartburn, nausea and vomiting.  Genitourinary:  Negative for dysuria, frequency and urgency.  Neurological:  Negative for dizziness and headaches.  Endo/Heme/Allergies:  Negative for polydipsia.  Psychiatric/Behavioral:  Negative for depression and suicidal ideas. The patient is not nervous/anxious.       Objective:     BP 124/68   Pulse 65   Temp (!) 97.2 F (36.2 C) (Temporal)   Ht 5' 8 (1.727 m)   Wt 238 lb (108 kg)   SpO2 98%   BMI 36.19 kg/m  BP Readings from Last 3 Encounters:  12/16/23 124/68  12/03/23 138/78  05/23/23 (!) 141/101   Wt Readings from Last 3 Encounters:  12/16/23 238 lb (108 kg)  12/03/23 241 lb 6.4 oz (109.5 kg)  04/24/22 233 lb 14.5 oz (106.1 kg)      Physical Exam Vitals and nursing note reviewed.  Constitutional:      Appearance: Normal appearance.  Cardiovascular:     Rate and Rhythm: Normal rate and regular rhythm.     Pulses: Normal pulses.     Heart sounds: Normal heart sounds.  Pulmonary:     Effort: Pulmonary effort is normal.     Breath sounds: Normal breath sounds.  Neurological:     Mental Status: She is alert and oriented to person, place, and time.  Psychiatric:        Mood and Affect: Mood normal.        Behavior: Behavior normal.        Thought Content: Thought content normal.        Judgment: Judgment normal.      No results found for any visits on 12/16/23.     The 10-year ASCVD risk score (Arnett DK, et al., 2019) is: 11.8%    Assessment & Plan:  Type 2 diabetes mellitus with hyperglycemia, unspecified whether long term insulin use (HCC) -     Tirzepatide ; Inject 2.5 mg into the skin once a week.  Dispense: 2 mL; Refill: 0  Hypertension associated with diabetes (HCC) -     Basic metabolic panel with GFR  Hyperlipidemia associated with type 2 diabetes mellitus (HCC) -     Lipid panel    Assessment and Plan Assessment &  Plan Hypertension secondary to type 2 diabetes mellitus Blood pressure controlled with current regimen. Spironolactone  addition improved readings. Difficulty with tablet cutting noted. - Continue amlodipine 5 mg BID, hydrochlorothiazide  25 mg daily, metoprolol 25 mg BID, spironolactone  12.5 mg  daily. - Monitor blood pressure at home. Consider increasing spironolactone  if readings return to 140s. - Report if readings exceed 140/90 mmHg. - follow up in three months.  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 Metformin   XR causing diarrhea. Considering Mounjaro  and Jardiance due to side effects and insurance issues. - Continue metformin  XR and monitor diarrhea. - rx sent for mounjaro .  - Monitor blood sugar levels, report if morning readings are in the 150s. - Encourage dietary modifications to reduce carbohydrate intake. - follow up in three months.  Mixed hyperlipidemia associated with type 2 diabetes mellitus Tolerating rosuvastatin well. Lipid panel ordered. - Order lipid panel. - Continue rosuvastatin 20 mg daily. - follow up in three months.   Return in about 3 months (around 03/16/2024) for HTN, diabetes, shingrix  2nd dose.SABRA Carrol Aurora, NP

## 2023-12-16 NOTE — Patient Instructions (Addendum)
 Stop by the lab prior to leaving today. I will notify you of your results once received.   Schedule diabetic eye exam.  Continue metformin  for now. Keep me posted about about the mounjaro .   Follow up in 3 months.   It was a pleasure to see you today!

## 2023-12-16 NOTE — Telephone Encounter (Signed)
 Clinical questions have been answered and PA submitted. PA currently Pending. Please be advised that most companies allow up to 30 days to make a decision. We will advise when a determination has been made, or follow up in 1 week.   Please reach out to our team, Rx Prior Auth Pool, if you haven't heard back in a week.

## 2023-12-20 ENCOUNTER — Other Ambulatory Visit (HOSPITAL_COMMUNITY): Payer: Self-pay

## 2023-12-20 NOTE — Telephone Encounter (Signed)
 Pharmacy Patient Advocate Encounter  Received notification from [facility] that Prior Authorization for Mounjaro  2.5 has been CANCELLED due to not needed. Patient 28 day copay $163.93 There is an open PA for Ozempic that expires 07/07/25. That copay $290.04. Which medication is patient taking?  PA #/Case ID/Reference #: # [id number]

## 2023-12-20 NOTE — Telephone Encounter (Signed)
Patient is taking mounjaro

## 2024-01-17 ENCOUNTER — Ambulatory Visit: Payer: Self-pay

## 2024-01-17 NOTE — Telephone Encounter (Signed)
 FYI Only or Action Required?: Action required by provider: clinical question for provider.  Patient was last seen in primary care on 12/16/2023 by Vincente Shivers, NP.  Called Nurse Triage reporting Stye.  Symptoms began several days ago.  Interventions attempted: Ice/heat application.  Symptoms are: unchanged.  Triage Disposition: See PCP When Office is Open (Within 3 Days)  Patient/caregiver understands and will follow disposition?: Yes     Reason for Disposition  [1] After 5 days of treatment per [facility] Advice AND [2] not better  Answer Assessment - Initial Assessment Questions Additional info:  1) Patient thought she had a mole develop on her eyelid but then people at work were saying it looks like a stye. She has been apply warm compresses but not noticing any change. She denies pain, itching, and drainage. She states she was going to purchase otc stye treatment from pharmacy but they recommended she should see her doctor for an antibiotic. Next available appointment with pcp was scheduled on 01/20/24. Patient is wondering if an antibiotic [facility] in the [facility]. Please advise.     1. LOCATION: Which eye has the sty? Upper or lower eyelid?     Left eye, upper lid  2. SIZE: How big is it? (Note: standard pencil eraser is 6 mm)     Size of a green pea 3. EYELID: Is the eyelid swollen? If Yes, ask: How much?     no 4. REDNESS: Has the redness spread onto the eyelid?     no 5. ONSET: When did you notice the sty?     5 days ago 6. VISION: Do you have blurred vision?      Slightly blurry from left due to squinting  7. PAIN: Is it painful? If Yes, ask: How bad is the pain?  (Scale 1-10; or mild, moderate, severe)     denies 8. CONTACTS: Do you wear contacts?     no 9. OTHER SYMPTOMS: Do you have any other symptoms? (e.g., fever)     Denies all other symptoms.  Protocols used: Sty-A-AH

## 2024-01-17 NOTE — Telephone Encounter (Signed)
 First attempt to contact patient for triage. LVM for patient to return call to [phone]. Placed into callbacks for follow up  Copied from CRM #[id number]. Topic: Clinical - Medical Advice >> Jan 17, 2024  1:08 PM Nessti S wrote: Reason for CRM: pt called because she has sty on left eye and would like ointment prescribed. Call back number [phone]

## 2024-01-18 NOTE — Telephone Encounter (Signed)
 We had a cancellation on the schedule for tomorrow. Called patient and moved appt to tomorrow for eval.

## 2024-01-19 ENCOUNTER — Ambulatory Visit (INDEPENDENT_AMBULATORY_CARE_PROVIDER_SITE_OTHER): Admitting: General Practice

## 2024-01-19 ENCOUNTER — Other Ambulatory Visit: Payer: Self-pay | Admitting: General Practice

## 2024-01-19 ENCOUNTER — Encounter: Payer: Self-pay | Admitting: General Practice

## 2024-01-19 VITALS — BP 110/80 | HR 66 | Temp 97.2°F | Ht 68.0 in | Wt 244.0 lb

## 2024-01-19 DIAGNOSIS — H00014 Hordeolum externum left upper eyelid: Secondary | ICD-10-CM | POA: Diagnosis not present

## 2024-01-19 DIAGNOSIS — E1159 Type 2 diabetes mellitus with other circulatory complications: Secondary | ICD-10-CM

## 2024-01-19 DIAGNOSIS — I152 Hypertension secondary to endocrine disorders: Secondary | ICD-10-CM

## 2024-01-19 DIAGNOSIS — Z7984 Long term (current) use of oral hypoglycemic drugs: Secondary | ICD-10-CM

## 2024-01-19 DIAGNOSIS — R0609 Other forms of dyspnea: Secondary | ICD-10-CM

## 2024-01-19 DIAGNOSIS — E1165 Type 2 diabetes mellitus with hyperglycemia: Secondary | ICD-10-CM | POA: Diagnosis not present

## 2024-01-19 MED ORDER — ERYTHROMYCIN 5 MG/GM OP OINT: 1.0000 | TOPICAL_OINTMENT | Freq: Four times a day (QID) | OPHTHALMIC | 0 refills | Status: AC

## 2024-01-19 MED ORDER — SPIRONOLACTONE 25 MG PO TABS: 25.0000 mg | ORAL_TABLET | Freq: Every day | ORAL | 1 refills | Status: DC

## 2024-01-19 MED ORDER — HYDROCHLOROTHIAZIDE 25 MG PO TABS
25.0000 mg | ORAL_TABLET | Freq: Every day | ORAL | 1 refills | Status: DC
Start: 2024-01-19 — End: 2024-01-20

## 2024-01-19 NOTE — Progress Notes (Signed)
 Established Patient Office Visit  Subjective   Patient ID: Sheila Eaton, female    DOB: 1960/06/20  Age: 63 y.o. MRN: [id number]  Chief Complaint  Patient presents with   Stye    In left eye x 5 days; no drainage, itching or pain. Patient has been applying warm compresses to eye.     HPI  Sheila Eaton is a 63 year old female with past medical history HTN, HLD, DM2, SLE, myalgias presents today for an acute visit to discuss a stye.   Discussed the use of AI scribe software for clinical note transcription with the patient, who gave verbal consent to proceed.  History of Present Illness Sheila Eaton is a 63 year old female who presents with a sty on her left upper eyelid.  She developed a stye on her left upper eyelid approximately five days ago. Initially, it appeared larger but has since reduced slightly in size with the use of warm compresses. There is no pain, itching, or discharge from the eye, and her vision remains unaffected. She does not frequently use eye makeup and has not used any recently. She recalls having a sty over twenty years ago but does not remember the treatment used at that time.  She is managing hypertension with hydrochlorothiazide  and spironolactone . Her blood pressure has been stable, and she has recently started taking a full dose of spironolactone  instead of the prescribed half dose.  For diabetes management, she is taking metformin . Her A1c was previously higher but is now at 6.4. She has not been using Mounjaro  due to cost and is exploring options for more affordable medications. She is concerned about her insurance coverage and the cost of medications like Ozempic and Mounjaro .  She has a history of smoking and experiences dyspnea with exertion, although she is not currently experiencing any respiratory symptoms. A previous doctor mentioned possible mild COPD, but she has not undergone specific testing for  it.   Patient Active Problem List   Diagnosis Date Noted   Dyspnea on exertion 01/19/2024   Hordeolum externum left upper eyelid 01/19/2024   Hyperlipidemia associated with type 2 diabetes mellitus (HCC) 12/03/2023   Establishing care with new doctor, encounter for 12/03/2023   SLE (systemic lupus erythematosus related syndrome) (HCC) 12/03/2023   Type 2 diabetes mellitus with hyperglycemia (HCC) 12/03/2023   Body aches 02/20/2022   Hypertension associated with diabetes (HCC) 08/03/2019   Myalgia 05/03/2019   Screening for osteoporosis 05/03/2019   Lumbar spondylosis 11/11/2018   Trochanteric bursitis, left hip 11/11/2018   Trochanteric bursitis, right hip 11/11/2018   Past Medical History:  Diagnosis Date   Allergy    Arthritis    Blood transfusion without reported diagnosis    Collagen vascular disease    lupus   Diabetes mellitus without complication (HCC)    Diverticulitis    History of chicken pox    Hypertension    Lupus    Migraines    Past Surgical History:  Procedure Laterality Date   ABDOMINAL HYSTERECTOMY     COLONOSCOPY WITH PROPOFOL  N/A 10/21/2018   Procedure: COLONOSCOPY WITH PROPOFOL ;  Surgeon: Therisa Bi, MD;  Location: [facility] ENDOSCOPY;  Service: Gastroenterology;  Laterality: N/A;   FLEXIBLE SIGMOIDOSCOPY N/A 09/22/2019   Procedure: FLEXIBLE SIGMOIDOSCOPY;  Surgeon: Therisa Bi, MD;  Location: [facility] ENDOSCOPY;  Service: Gastroenterology;  Laterality: N/A;   LEG SURGERY     VASCULAR SURGERY     Allergies  Allergen Reactions   Citrus Dermatitis  Other     Rash with citrus and chocolate         01/19/2024   11:23 AM 12/16/2023    8:45 AM 12/03/2023    4:16 PM  Depression screen PHQ 2/9  Decreased Interest 0 0 0  Down, Depressed, Hopeless 0 0 0  PHQ - 2 Score 0 0 0  Altered sleeping 0 1 1  Tired, decreased energy 0 1 1  Change in appetite 0 0 1  Feeling bad or failure about yourself  0 0 0  Trouble concentrating 0 0 0  Moving slowly or  fidgety/restless 0 0 0  Suicidal thoughts 0 0 0  PHQ-9 Score 0 2 3  Difficult doing work/chores Not difficult at all Not difficult at all Not difficult at all       01/19/2024   11:23 AM 12/16/2023    8:46 AM 12/03/2023    4:16 PM  GAD 7 : Generalized Anxiety Score  Nervous, Anxious, on Edge 0 0 0  Control/stop worrying 0 0 1  Worry too much - different things 0 1 1  Trouble relaxing 0 0 0  Restless 0 0 0  Easily annoyed or irritable 0 0 0  Afraid - awful might happen 0 0 0  Total GAD 7 Score 0 1 2  Anxiety Difficulty Not difficult at all Not difficult at all Not difficult at all      Review of Systems  Constitutional:  Negative for chills and fever.  Respiratory:  Negative for shortness of breath.   Cardiovascular:  Negative for chest pain.  Gastrointestinal:  Negative for abdominal pain, constipation, diarrhea, heartburn, nausea and vomiting.  Genitourinary:  Negative for dysuria, frequency and urgency.  Skin:        Stye on her left eye  Neurological:  Negative for dizziness and headaches.  Endo/Heme/Allergies:  Negative for polydipsia.  Psychiatric/Behavioral:  Negative for depression and suicidal ideas. The patient is not nervous/anxious.       Objective:     BP 110/80   Pulse 66   Temp (!) 97.2 F (36.2 C) (Temporal)   Ht 5' 8 (1.727 m)   Wt 244 lb (110.7 kg)   SpO2 99%   BMI 37.10 kg/m  BP Readings from Last 3 Encounters:  01/19/24 110/80  12/16/23 124/68  12/03/23 138/78   Wt Readings from Last 3 Encounters:  01/19/24 244 lb (110.7 kg)  12/16/23 238 lb (108 kg)  12/03/23 241 lb 6.4 oz (109.5 kg)      Physical Exam Vitals and nursing note reviewed.  Constitutional:      Appearance: Normal appearance.  Eyes:     General:        Right eye: No hordeolum.        Left eye: Hordeolum present.No foreign body or discharge.     Extraocular Movements: Extraocular movements intact.     Conjunctiva/sclera: Conjunctivae normal.     Left eye: Left  conjunctiva is not injected.   Cardiovascular:     Rate and Rhythm: Normal rate and regular rhythm.     Pulses: Normal pulses.     Heart sounds: Normal heart sounds.  Pulmonary:     Effort: Pulmonary effort is normal.     Breath sounds: Normal breath sounds.  Neurological:     Mental Status: She is alert and oriented to person, place, and time.  Psychiatric:        Mood and Affect: Mood normal.  Behavior: Behavior normal.        Thought Content: Thought content normal.        Judgment: Judgment normal.      No results found for any visits on 01/19/24.     The ASCVD Risk score (Arnett DK, et al., 2019) failed to calculate for the following reasons:   The valid total cholesterol range is 130 to 320 mg/dL    Assessment & Plan:  Hypertension associated with diabetes (HCC) -     hydroCHLOROthiazide ; Take 1 tablet (25 mg total) by mouth daily.  Dispense: 25 tablet; Refill: 1 -     Spironolactone ; Take 1 tablet (25 mg total) by mouth daily. For blood pressure.  Dispense: 30 tablet; Refill: 1  Type 2 diabetes mellitus with hyperglycemia, unspecified whether long term insulin use (HCC)  Hordeolum externum left upper eyelid -     Erythromycin; Place 1 Application into the left eye 4 (four) times daily for 7 days.  Dispense: 28 g; Refill: 0 -     AMB Referral VBCI Care Management  Dyspnea on exertion    Assessment and Plan Assessment & Plan Hordeolum (sty), left upper eyelid Hordeolum confirmed on the left upper eyelid. Warm compresses provided slight relief. - Prescribe erythromycin ointment, apply four times daily for one week.  - Follow up if symptoms worsen or do not improve.  Hypertension associated with type 2 diabetes mellitus Blood pressure well-controlled with current regimen. - Continue current antihypertensive regimen including spironolactone  25 mg once daily.  - Reassess medication regimen in December, considering valsartan dosage reduction.  Type 2  diabetes mellitus with hyperglycemia A1c improved to 6.4. Financial constraints limit GLP-1 agonists use. Alternative medications considered. Pharmacy consult initiated for financial assistance. - Continue metformin  with breakfast. - Initiate pharmacy consult for financial assistance for GLP-1 agonists. Engineer, Maintenance (it) insurance for preferred diabetes medications with lower copay. - Monitor blood glucose levels and dietary intake.   Return if symptoms worsen or fail to improve.    Carrol Aurora, NP

## 2024-01-19 NOTE — Patient Instructions (Addendum)
 You will either be contacted via phone regarding your referral for pharmacy  , or you may receive a letter on your MyChart portal from our referral team with instructions for scheduling an appointment. Please let us  know if you have not been contacted by anyone within two weeks.  Start erythromycin 4 times a daily for 7 days.   Keep follow up for December.  Follow up if symptoms worsen or do not improve.   It was a pleasure to see you today!

## 2024-01-20 ENCOUNTER — Ambulatory Visit: Admitting: General Practice

## 2024-01-21 ENCOUNTER — Telehealth: Payer: Self-pay

## 2024-01-21 NOTE — Progress Notes (Signed)
 Complex Care Management Note Care Guide Note  01/21/2024 Name: Sheila Eaton MRN: [id number] DOB: 1960-09-23   Complex Care Management Outreach Attempts: An unsuccessful telephone outreach was attempted today to offer the patient information about available complex care management services.  Follow Up Plan:  Additional outreach attempts will be made to offer the patient complex care management information and services.   Encounter Outcome:  No Answer  Dreama [facility]  [facility], [facility] VBCI Assistant Direct Dial: [phone]  Fax: [phone]

## 2024-01-24 NOTE — Progress Notes (Unsigned)
 Complex Care Management Note Care Guide Note  01/24/2024 Name: Sheila Eaton MRN: [id number] DOB: [birth year]   Complex Care Management Outreach Attempts: A second unsuccessful outreach was attempted today to offer the patient with information about available complex care management services.  Follow Up Plan:  Additional outreach attempts will be made to offer the patient complex care management information and services.   Encounter Outcome:  No Answer  Dreama [facility]  [facility], [facility] VBCI Assistant Direct Dial: [phone]  Fax: [phone]

## 2024-01-27 NOTE — Progress Notes (Signed)
 Complex Care Management Note Care Guide Note  01/27/2024 Name: Sheila Eaton MRN: [id number] DOB: 1960-10-11   Complex Care Management Outreach Attempts: A third unsuccessful outreach was attempted today to offer the patient with information about available complex care management services.  Follow Up Plan:  No further outreach attempts will be made at this time. We have been unable to contact the patient to offer or enroll patient in complex care management services.  Encounter Outcome:  No Answer  Dreama [facility]  [facility], [facility] VBCI Assistant Direct Dial: [phone]  Fax: [phone]

## 2024-02-10 ENCOUNTER — Other Ambulatory Visit: Payer: Self-pay | Admitting: General Practice

## 2024-02-10 DIAGNOSIS — E1159 Type 2 diabetes mellitus with other circulatory complications: Secondary | ICD-10-CM

## 2024-03-02 ENCOUNTER — Other Ambulatory Visit: Payer: Self-pay | Admitting: General Practice

## 2024-03-02 DIAGNOSIS — I152 Hypertension secondary to endocrine disorders: Secondary | ICD-10-CM

## 2024-03-02 DIAGNOSIS — E1165 Type 2 diabetes mellitus with hyperglycemia: Secondary | ICD-10-CM

## 2024-03-14 ENCOUNTER — Ambulatory Visit

## 2024-03-14 DIAGNOSIS — Z23 Encounter for immunization: Secondary | ICD-10-CM | POA: Diagnosis not present

## 2024-03-14 NOTE — Progress Notes (Signed)
 Per orders of Carrol Aurora, DPN AGNP-C, injection of Shingles  given by Harlene KATHEE Arenas in Left deltoid. Patient tolerated injection well.

## 2024-03-20 ENCOUNTER — Ambulatory Visit: Admitting: General Practice

## 2024-03-21 ENCOUNTER — Ambulatory Visit: Admitting: General Practice

## 2024-03-21 ENCOUNTER — Ambulatory Visit

## 2024-03-27 ENCOUNTER — Ambulatory Visit: Admitting: General Practice

## 2024-03-27 ENCOUNTER — Encounter: Payer: Self-pay | Admitting: General Practice

## 2024-03-27 VITALS — BP 118/80 | HR 73 | Temp 97.3°F | Ht 68.0 in | Wt 245.0 lb

## 2024-03-27 DIAGNOSIS — M329 Systemic lupus erythematosus, unspecified: Secondary | ICD-10-CM

## 2024-03-27 DIAGNOSIS — E1159 Type 2 diabetes mellitus with other circulatory complications: Secondary | ICD-10-CM

## 2024-03-27 DIAGNOSIS — Z7985 Long-term (current) use of injectable non-insulin antidiabetic drugs: Secondary | ICD-10-CM | POA: Diagnosis not present

## 2024-03-27 DIAGNOSIS — H00014 Hordeolum externum left upper eyelid: Secondary | ICD-10-CM | POA: Diagnosis not present

## 2024-03-27 DIAGNOSIS — I152 Hypertension secondary to endocrine disorders: Secondary | ICD-10-CM | POA: Diagnosis not present

## 2024-03-27 DIAGNOSIS — Z23 Encounter for immunization: Secondary | ICD-10-CM

## 2024-03-27 DIAGNOSIS — E785 Hyperlipidemia, unspecified: Secondary | ICD-10-CM

## 2024-03-27 DIAGNOSIS — E1169 Type 2 diabetes mellitus with other specified complication: Secondary | ICD-10-CM

## 2024-03-27 DIAGNOSIS — E1165 Type 2 diabetes mellitus with hyperglycemia: Secondary | ICD-10-CM | POA: Diagnosis not present

## 2024-03-27 MED ORDER — AMLODIPINE-VALSARTAN-HCTZ 10-320-25 MG PO TABS: ORAL_TABLET | ORAL | 0 refills | Status: AC

## 2024-03-27 MED ORDER — SPIRONOLACTONE 25 MG PO TABS: 25.0000 mg | ORAL_TABLET | Freq: Every day | ORAL | 1 refills | Status: AC

## 2024-03-27 MED ORDER — TIRZEPATIDE 2.5 MG/0.5ML ~~LOC~~ SOAJ: 2.5000 mg | SUBCUTANEOUS | 1 refills | Status: AC

## 2024-03-27 NOTE — Progress Notes (Signed)
 "  Established Patient Office Visit  Subjective   Patient ID: Sheila Eaton, female    DOB: May 27, 1960  Age: 64 y.o. MRN: [id number]  Chief Complaint  Patient presents with   Diabetes    Patient following up on DM. Patient taking metformin    Hypertension    Patient following up on BP. Patient currently on valsartan , amlodipiine and hydrochlorothiazide .     Diabetes Pertinent negatives for hypoglycemia include no dizziness, headaches or nervousness/anxiousness. Pertinent negatives for diabetes include no chest pain and no polydipsia.  Hypertension Pertinent negatives include no chest pain, headaches or shortness of breath.    Discussed the use of AI scribe software for clinical note transcription with the patient, who gave verbal consent to proceed.  History of Present Illness Sheila Eaton is a 64 year old female with hypertension and diabetes who presents for medication management and follow-up.  Her current medication regimen for HTN includes valsartan  320 mg once daily, amlodipine  5 mg twice daily, hydrochlorothiazide  25 mg once daily, metoprolol 25 mg twice daily, and spironolactone  12.5 mg once daily. She notes significant improvement in her blood pressure since starting spironolactone  and has been taking a full tablet instead of the prescribed half due to difficulty cutting the pill.  She takes omega-3 fish oil and vitamin D supplements for cholesterol management. Her cholesterol levels were last checked in September and were within target range, with an LDL of 59. She continues to take a cholesterol-lowering medication for cardiac protection due to her diabetes.  She is trying to lose weight and has increased her physical activity, including walking and chair exercises, despite experiencing muscle fatigue due to lupus. She monitors her blood sugar at home, which ranges from 97 to 138 mg/dL, with occasional spikes. Her last A1c was 6.4%.  She has a history  of lupus and sees a rheumatologist regularly. Her last visit was in December, where her CK levels were checked.     Patient Active Problem List   Diagnosis Date Noted   Dyspnea on exertion 01/19/2024   Hordeolum externum left upper eyelid 01/19/2024   Hyperlipidemia associated with type 2 diabetes mellitus (HCC) 12/03/2023   Establishing care with new doctor, encounter for 12/03/2023   SLE (systemic lupus erythematosus related syndrome) (HCC) 12/03/2023   Type 2 diabetes mellitus with hyperglycemia (HCC) 12/03/2023   Body aches 02/20/2022   Hypertension associated with diabetes (HCC) 08/03/2019   Myalgia 05/03/2019   Screening for osteoporosis 05/03/2019   Lumbar spondylosis 11/11/2018   Trochanteric bursitis, left hip 11/11/2018   Trochanteric bursitis, right hip 11/11/2018   Past Medical History:  Diagnosis Date   Allergy    Arthritis    Blood transfusion without reported diagnosis    Collagen vascular disease    lupus   Diabetes mellitus without complication (HCC)    Diverticulitis    History of chicken pox    Hypertension    Lupus    Migraines    Past Surgical History:  Procedure Laterality Date   ABDOMINAL HYSTERECTOMY     COLONOSCOPY WITH PROPOFOL  N/A 10/21/2018   Procedure: COLONOSCOPY WITH PROPOFOL ;  Surgeon: Therisa Bi, MD;  Location: [facility] ENDOSCOPY;  Service: Gastroenterology;  Laterality: N/A;   FLEXIBLE SIGMOIDOSCOPY N/A 09/22/2019   Procedure: FLEXIBLE SIGMOIDOSCOPY;  Surgeon: Therisa Bi, MD;  Location: [facility] ENDOSCOPY;  Service: Gastroenterology;  Laterality: N/A;   LEG SURGERY     VASCULAR SURGERY     Allergies[1]       03/27/2024  3:46 PM 01/19/2024   11:23 AM 12/16/2023    8:45 AM  Depression screen PHQ 2/9  Decreased Interest 0 0 0  Down, Depressed, Hopeless 0 0 0  PHQ - 2 Score 0 0 0  Altered sleeping 1 0 1  Tired, decreased energy 1 0 1  Change in appetite 0 0 0  Feeling bad or failure about yourself  0 0 0  Trouble concentrating 0 0 0   Moving slowly or fidgety/restless 0 0 0  Suicidal thoughts 0 0 0  PHQ-9 Score 2 0  2   Difficult doing work/chores Not difficult at all Not difficult at all Not difficult at all     Data saved with a previous flowsheet row definition       03/27/2024    3:46 PM 01/19/2024   11:23 AM 12/16/2023    8:46 AM 12/03/2023    4:16 PM  GAD 7 : Generalized Anxiety Score  Nervous, Anxious, on Edge 0 0 0 0  Control/stop worrying 0 0 0 1  Worry too much - different things 0 0 1 1  Trouble relaxing 0 0 0 0  Restless 0 0 0 0  Easily annoyed or irritable 0 0 0 0  Afraid - awful might happen 0 0 0 0  Total GAD 7 Score 0 0 1 2  Anxiety Difficulty Not difficult at all Not difficult at all Not difficult at all Not difficult at all      Review of Systems  Constitutional:  Negative for chills and fever.  Respiratory:  Negative for shortness of breath.   Cardiovascular:  Negative for chest pain.  Gastrointestinal:  Negative for abdominal pain, constipation, diarrhea, heartburn, nausea and vomiting.  Genitourinary:  Negative for dysuria, frequency and urgency.  Neurological:  Negative for dizziness and headaches.  Endo/Heme/Allergies:  Negative for polydipsia.  Psychiatric/Behavioral:  Negative for depression and suicidal ideas. The patient is not nervous/anxious.       Objective:     BP 118/80   Pulse 73   Temp (!) 97.3 F (36.3 C) (Temporal)   Ht 5' 8 (1.727 m)   Wt 245 lb (111.1 kg)   SpO2 96%   BMI 37.25 kg/m  BP Readings from Last 3 Encounters:  03/27/24 118/80  01/19/24 110/80  12/16/23 124/68   Wt Readings from Last 3 Encounters:  03/27/24 245 lb (111.1 kg)  01/19/24 244 lb (110.7 kg)  12/16/23 238 lb (108 kg)      Physical Exam Vitals and nursing note reviewed.  Constitutional:      Appearance: Normal appearance.  Cardiovascular:     Rate and Rhythm: Normal rate and regular rhythm.     Pulses: Normal pulses.     Heart sounds: Normal heart sounds.  Pulmonary:      Effort: Pulmonary effort is normal.     Breath sounds: Normal breath sounds.  Neurological:     Mental Status: She is alert and oriented to person, place, and time.  Psychiatric:        Mood and Affect: Mood normal.        Behavior: Behavior normal.        Thought Content: Thought content normal.        Judgment: Judgment normal.      No results found for any visits on 03/27/24.     The ASCVD Risk score (Arnett DK, et al., 2019) failed to calculate for the following reasons:   The valid total cholesterol range  is 130 to 320 mg/dL    Assessment & Plan:  Hypertension associated with diabetes (HCC) -     Spironolactone ; Take 1 tablet (25 mg total) by mouth daily.  Dispense: 90 tablet; Refill: 1 -     amLODIPine -Valsartan -HCTZ; Take one tablet daily for blood pressure.  Dispense: 90 tablet; Refill: 0 -     Basic metabolic panel with GFR  Type 2 diabetes mellitus with hyperglycemia, unspecified whether long term insulin use (HCC) -     Tirzepatide ; Inject 2.5 mg into the skin once a week.  Dispense: 2 mL; Refill: 1 -     Basic metabolic panel with GFR -     Hemoglobin A1c  Need for pneumococcal 20-valent conjugate vaccination -     Pneumococcal conjugate vaccine 20-valent   Assessment and Plan Assessment & Plan Hypertension associated with type 2 diabetes mellitus Blood pressure well-controlled. Plan to reduce pill burden with combination medication. - Continue valsartan  320 mg daily. - Continue amlodipine  5 mg twice daily. - Continue hydrochlorothiazide  25 mg daily. - Continue metoprolol 25 mg twice daily. - Continue spironolactone  25 mg daily. - Prescribed combination pill of valsartan , amlodipine , and hydrochlorothiazide  once daily. - Sent prescription for 90-day supply to pharmacy. - Monitor blood pressure and kidney function.  Type 2 diabetes mellitus with hyperglycemia Blood sugar levels mostly controlled, occasional spikes. Last A1c 6.4%. Discussed Mounjaro  for  weight loss and diabetes management. - Continue metformin . - Ordered A1c test to assess current diabetes control. - Monitor blood sugar levels at home. - Discussed potential use of Mounjaro  for weight loss and diabetes management, pending insurance coverage.  Systemic lupus erythematosus Lupus managed by rheumatologist. Recent labs showed good kidney function and inflammation levels. - Continue current management with rheumatologist. - Monitor kidney function due to multiple medications.  Hyperlipidemia Cholesterol levels well-controlled. LDL 59 mg/dL.  - Continue current cholesterol medication. - Continue omega-3 fish oil and vitamin D supplements.  Hordeolum externum left upper eyelid Hordeolum decreased in size, not bothersome. - Continue warm compresses as needed.  General Health Maintenance Discussed vaccinations and screenings. Pneumonia vaccine recommended due to diabetes. - Administered pneumonia vaccine. - Schedule mammogram for February. - Schedule diabetic eye exam.   Return in about 3 months (around 06/25/2024) for physical and fasting labs. Sheila Carrol Aurora, NP     [1]  Allergies Allergen Reactions   Citrus Dermatitis   Other Dermatitis    Rash with citrus and chocolate   "

## 2024-03-27 NOTE — Patient Instructions (Addendum)
 Stop by the lab prior to leaving today. I will notify you of your results once received.   Continue medications as is.   Let me know about Mounjaro .   Schedule diabetic eye exam.   Follow up in 3 months for physical and labs.  It was a pleasure to see you today!

## 2024-03-28 ENCOUNTER — Ambulatory Visit: Payer: Self-pay | Admitting: General Practice

## 2024-03-28 LAB — BASIC METABOLIC PANEL WITH GFR
BUN: 17 mg/dL (ref 6–23)
CO2: 29 meq/L (ref 19–32)
Calcium: 9.2 mg/dL (ref 8.4–10.5)
Chloride: 103 meq/L (ref 96–112)
Creatinine, Ser: 0.96 mg/dL (ref 0.40–1.20)
GFR: 62.69 mL/min
Glucose, Bld: 95 mg/dL (ref 70–99)
Potassium: 3.8 meq/L (ref 3.5–5.1)
Sodium: 138 meq/L (ref 135–145)

## 2024-03-28 LAB — HEMOGLOBIN A1C: Hgb A1c MFr Bld: 6.7 % — ABNORMAL HIGH (ref 4.6–6.5)

## 2024-03-28 NOTE — Progress Notes (Signed)
 Noted

## 2024-03-30 NOTE — Telephone Encounter (Signed)
 Copied from CRM #[id number]. Topic: General - Other >> Mar 30, 2024  8:39 AM Victoria A wrote: Reason for CRM: Patient received Mounjaro  and discontinued the Metformin  and 3 in 1 Blood Pressure pill and stop taking individual pills

## 2024-03-31 NOTE — Addendum Note (Signed)
 Addended by: TENNIE RAISIN B on: 03/31/2024 04:48 PM   Modules accepted: Orders

## 2024-04-03 ENCOUNTER — Ambulatory Visit: Payer: Self-pay | Admitting: *Deleted

## 2024-04-03 DIAGNOSIS — K5790 Diverticulosis of intestine, part unspecified, without perforation or abscess without bleeding: Secondary | ICD-10-CM

## 2024-04-03 NOTE — Telephone Encounter (Signed)
 Call disconnected from PAS. NT called patient back for triage. Recommended UC no available OV until Wednesday. Patient declined . See NT encounter      FYI Only or Action Required?: Action required by provider: request for appointment, referral request, and declined recommendation for UC.  Patient was last seen in primary care on 03/27/2024 by Vincente Shivers, NP.  Called Nurse Triage reporting Abdominal Cramping.  Symptoms began several days ago.  Interventions attempted: Rest, hydration, or home remedies, Ice/heat application, and Dietary changes.  Symptoms are: gradually worsening.  Triage Disposition: See HCP Within 4 Hours (Or PCP Triage)  Patient/caregiver understands and will follow disposition?: No, wishes to speak with PCP          Copied from CRM #[id number]. Topic: Clinical - Red Word Triage >> Apr 03, 2024 10:58 AM Hadassah PARAS wrote: Red Word that prompted transfer to Nurse Triage: Pt is experiencing cramping, abdominal pain that can go up to a #10 in pain, everything pt eats is bothering pt. Pt has diverticulitis. Transferred to NT Reason for Disposition  [1] MILD-MODERATE pain AND [2] constant AND [3] age > 60 years  Answer Assessment - Initial Assessment Questions Recommended UC no available appt with PCP or other providers. Patient reports she does not want to go to UC and get worse. Sx on and off x 1 year but sx returned Friday and not sure if due to starting mounjaro  on Wednesday . Patient requesting referral to GI. Please advise.              1. LOCATION: Where does it hurt?      Low abdomen  2. RADIATION: Does the pain shoot anywhere else? (e.g., chest, back)     Na  3. ONSET: When did the pain begin? (e.g., minutes, hours or days ago)      Friday after starting Mounjaro   4. SUDDEN: Gradual or sudden onset? Gradual  5. PATTERN Does the pain come and go, or is it constant?     Comes and goes with intensity but pain is constant but mild 6.  SEVERITY: How bad is the pain?  (e.g., Scale 1-10; mild, moderate, or severe)     Mild at this time but constant. Worsening intensity severe comes and goes after eating and drinking . 7. RECURRENT SYMPTOM: Have you ever had this type of stomach pain before? If Yes, ask: When was the last time? and What happened that time?      Yes hx diverticulitis 8. CAUSE: What do you think is causing the stomach pain? (e.g., gallstones, recent abdominal surgery)     Not sure  9. RELIEVING/AGGRAVATING FACTORS: What makes it better or worse? (e.g., antacids, bending or twisting motion, bowel movement)     Heating pad warm tea, not eating  10. OTHER SYMPTOMS: Do you have any other symptoms? (e.g., back pain, diarrhea, fever, urination pain, vomiting)       Low abdominal pain constant mild pain , severe pain comes and goes. No fever, no N/V/D. No chest pain  no difficulty breathing.  11. PREGNANCY: Is there any chance you are pregnant? When was your last menstrual period?       na  Protocols used: Abdominal Pain - Female-A-AH

## 2024-04-03 NOTE — Telephone Encounter (Signed)
 I can put in the referral for [facility]; however I am not sure if they will see her urgently.   I recommend that she gets evaluated in person by myself or someone at our clinic or UC or ER.   -Carrol Aurora, DNP, AGNP-C 04/03/2024 2:43 PM

## 2024-04-03 NOTE — Telephone Encounter (Signed)
 Spoke with patient and advised referral was done and advised if gets worse or no better to schedule an appt with either [facility] or someone else in the office that can see her; or UC. Patient verbalized understanding and agreed to be seen if gets worse and/or GI can not see her soon.

## 2024-04-03 NOTE — Telephone Encounter (Signed)
 Copied from CRM #[id number]. Topic: Referral - Request for Referral >> Apr 03, 2024 10:54 AM Hadassah PARAS wrote: Did the patient discuss referral with their provider in the last year? Yes (If No - schedule appointment) (If Yes - send message)  Appointment offered? No  Type of order/referral and detailed reason for visit: Routine Colonoscopy; pt is also experiencing abdominal  pain and would like to see a gastro. Pt has diverticulitis  Preference of office, provider, location: gastro  If referral order, have you been seen by this specialty before? Yes, 20 years ago for acid reflux  (If Yes, this issue or another issue? When? Where?  Can we respond through MyChart? No

## 2024-04-03 NOTE — Addendum Note (Signed)
 Addended by: VINCENTE SHIVERS on: 04/03/2024 02:48 PM   Modules accepted: Orders

## 2024-04-21 ENCOUNTER — Encounter: Payer: Self-pay | Admitting: Gastroenterology

## 2024-04-21 ENCOUNTER — Telehealth: Payer: Self-pay | Admitting: General Practice

## 2024-04-21 NOTE — Telephone Encounter (Signed)
 Copied from CRM #[id number]. Topic: Referral - Status >> Apr 21, 2024 10:38 AM Drema MATSU wrote: Reason for CRM: Pt called to check status of referral. Please call pt with an update.

## 2024-04-21 NOTE — Telephone Encounter (Signed)
 Pt missed call but call back is no longer needed as she was able to schedule her gastro appt for 02/25

## 2024-04-21 NOTE — Telephone Encounter (Signed)
 LMTCB

## 2024-05-17 ENCOUNTER — Ambulatory Visit: Admitting: Gastroenterology

## 2024-06-27 ENCOUNTER — Encounter: Admitting: General Practice
